# Patient Record
Sex: Female | Born: 1980 | Race: White | Hispanic: No | Marital: Single | Smoking: Current every day smoker
Health system: Southern US, Community
[De-identification: ages and names within clinical notes are randomized; demographics above are authoritative.]

## PROBLEM LIST (undated history)

## (undated) DIAGNOSIS — F329 Major depressive disorder, single episode, unspecified: Secondary | ICD-10-CM

## (undated) DIAGNOSIS — F419 Anxiety disorder, unspecified: Secondary | ICD-10-CM

## (undated) DIAGNOSIS — R51 Headache: Secondary | ICD-10-CM

## (undated) DIAGNOSIS — F1990 Other psychoactive substance use, unspecified, uncomplicated: Secondary | ICD-10-CM

## (undated) DIAGNOSIS — J449 Chronic obstructive pulmonary disease, unspecified: Secondary | ICD-10-CM

## (undated) DIAGNOSIS — F250 Schizoaffective disorder, bipolar type: Secondary | ICD-10-CM

## (undated) DIAGNOSIS — F32A Depression, unspecified: Secondary | ICD-10-CM

## (undated) DIAGNOSIS — F259 Schizoaffective disorder, unspecified: Secondary | ICD-10-CM

## (undated) DIAGNOSIS — J189 Pneumonia, unspecified organism: Secondary | ICD-10-CM

## (undated) DIAGNOSIS — D649 Anemia, unspecified: Secondary | ICD-10-CM

## (undated) HISTORY — DX: Depression, unspecified: F32.A

## (undated) HISTORY — DX: Major depressive disorder, single episode, unspecified: F32.9

---

## 2001-02-27 HISTORY — PX: THYROID SURGERY: SHX805

## 2007-09-04 ENCOUNTER — Emergency Department (HOSPITAL_COMMUNITY): Admission: EM | Admit: 2007-09-04 | Discharge: 2007-09-05 | Payer: Self-pay | Admitting: Emergency Medicine

## 2007-11-09 ENCOUNTER — Emergency Department (HOSPITAL_COMMUNITY): Admission: EM | Admit: 2007-11-09 | Discharge: 2007-11-09 | Payer: Self-pay | Admitting: Emergency Medicine

## 2008-01-29 ENCOUNTER — Emergency Department (HOSPITAL_COMMUNITY): Admission: EM | Admit: 2008-01-29 | Discharge: 2008-01-29 | Payer: Self-pay | Admitting: Emergency Medicine

## 2008-06-23 ENCOUNTER — Inpatient Hospital Stay (HOSPITAL_COMMUNITY): Admission: EM | Admit: 2008-06-23 | Discharge: 2008-06-26 | Payer: Self-pay | Admitting: Emergency Medicine

## 2008-06-28 ENCOUNTER — Observation Stay (HOSPITAL_COMMUNITY): Admission: EM | Admit: 2008-06-28 | Discharge: 2008-06-28 | Payer: Self-pay | Admitting: Emergency Medicine

## 2008-07-06 ENCOUNTER — Emergency Department (HOSPITAL_COMMUNITY): Admission: EM | Admit: 2008-07-06 | Discharge: 2008-07-06 | Payer: Self-pay | Admitting: Emergency Medicine

## 2008-09-03 ENCOUNTER — Emergency Department (HOSPITAL_COMMUNITY): Admission: EM | Admit: 2008-09-03 | Discharge: 2008-09-03 | Payer: Self-pay | Admitting: Emergency Medicine

## 2009-06-26 ENCOUNTER — Observation Stay (HOSPITAL_COMMUNITY): Admission: EM | Admit: 2009-06-26 | Discharge: 2009-06-27 | Payer: Self-pay | Admitting: Emergency Medicine

## 2009-12-06 IMAGING — CR DG ANKLE COMPLETE 3+V*R*
3 series · 3 of 3 positions shown · non-contrast
Comparison: None

CLINICAL DATA: Ankle injury post fall

RIGHT ANKLE - COMPLETE 3+ VIEW

[t ankle joint ap right]
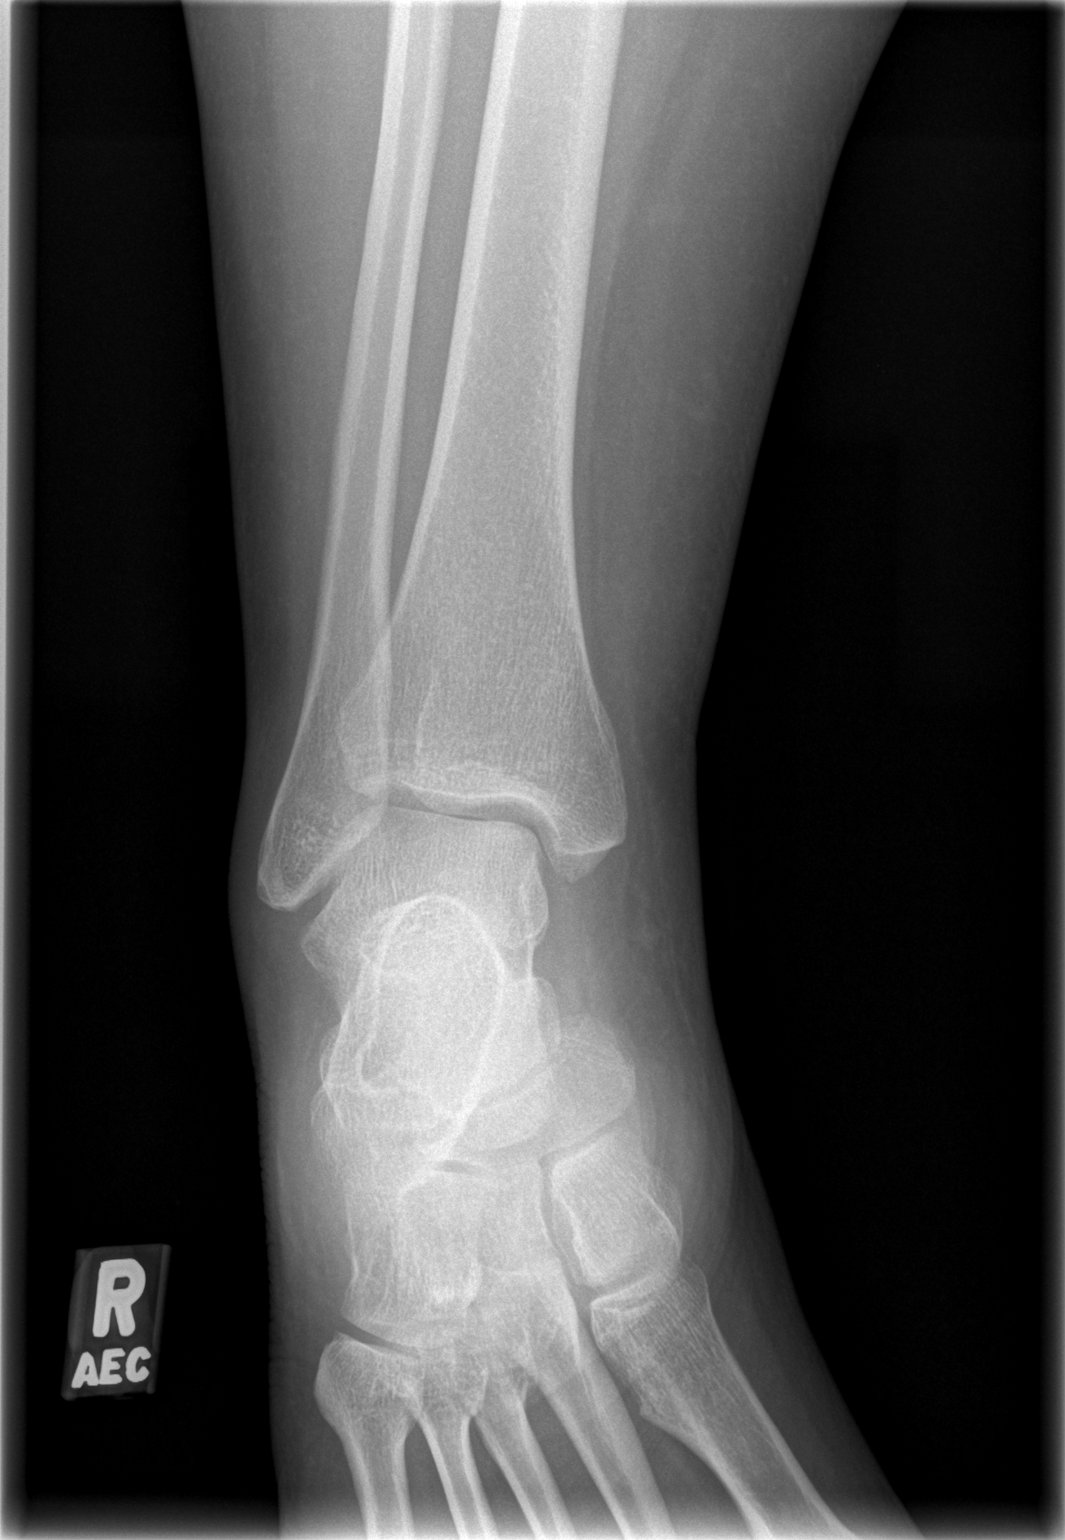

[t ankle joint oblique right]
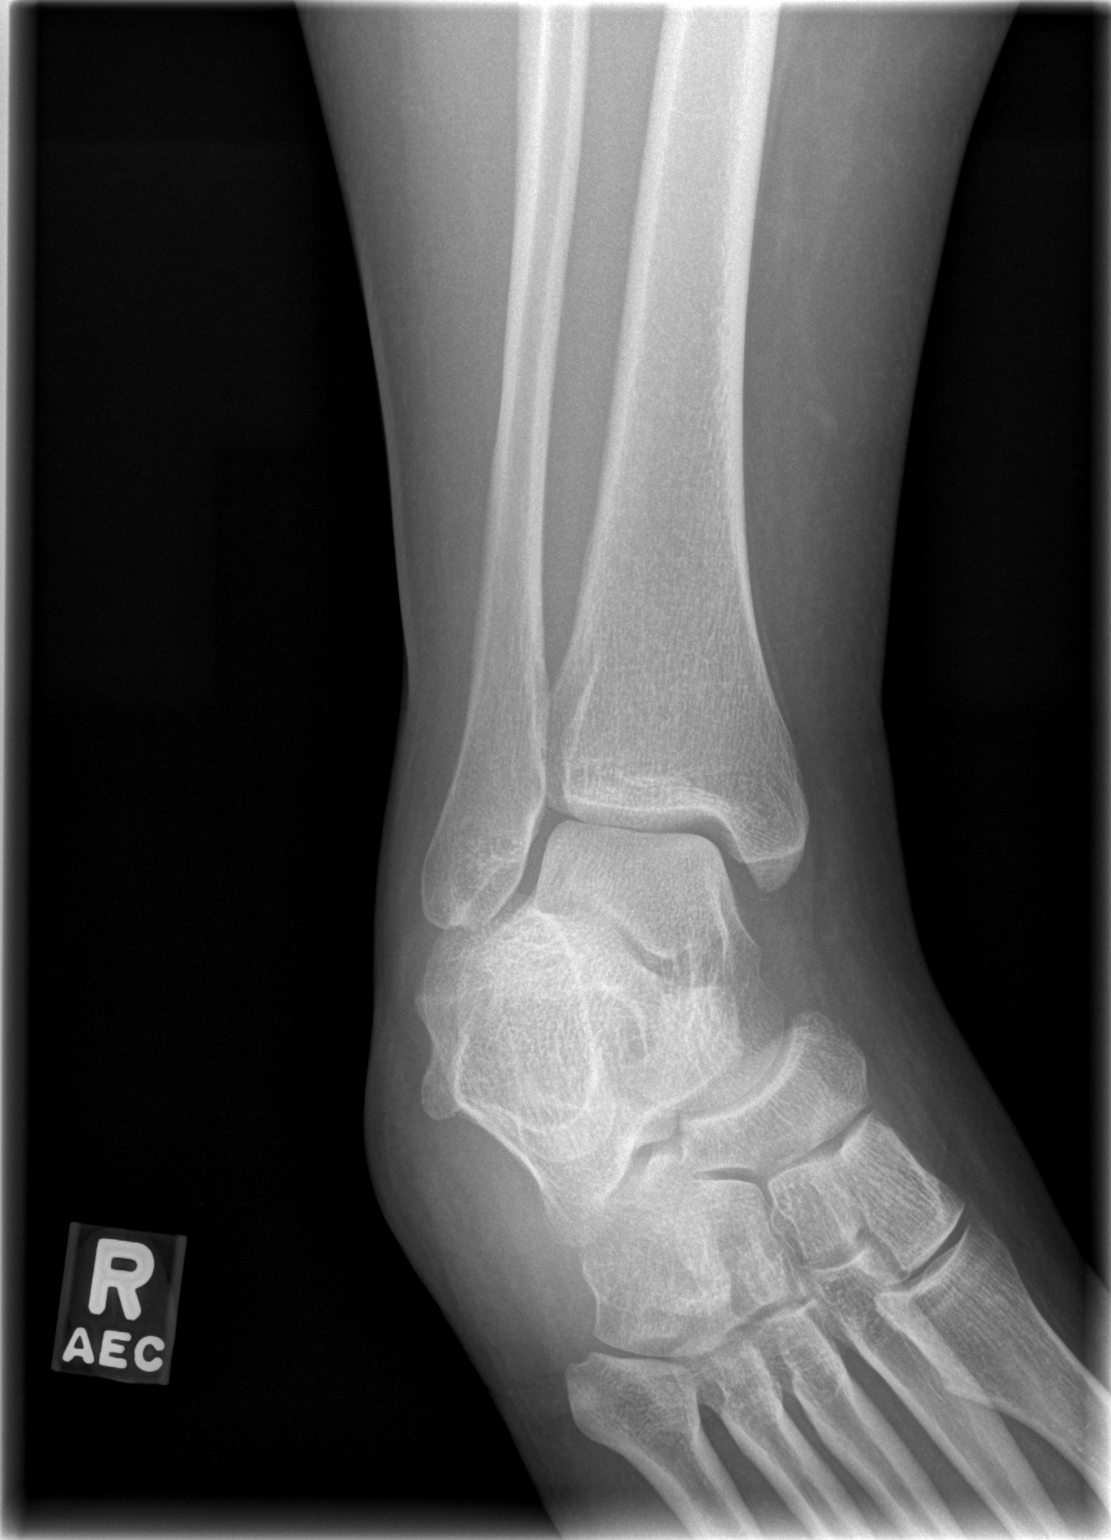

[t ankle joint lat right]
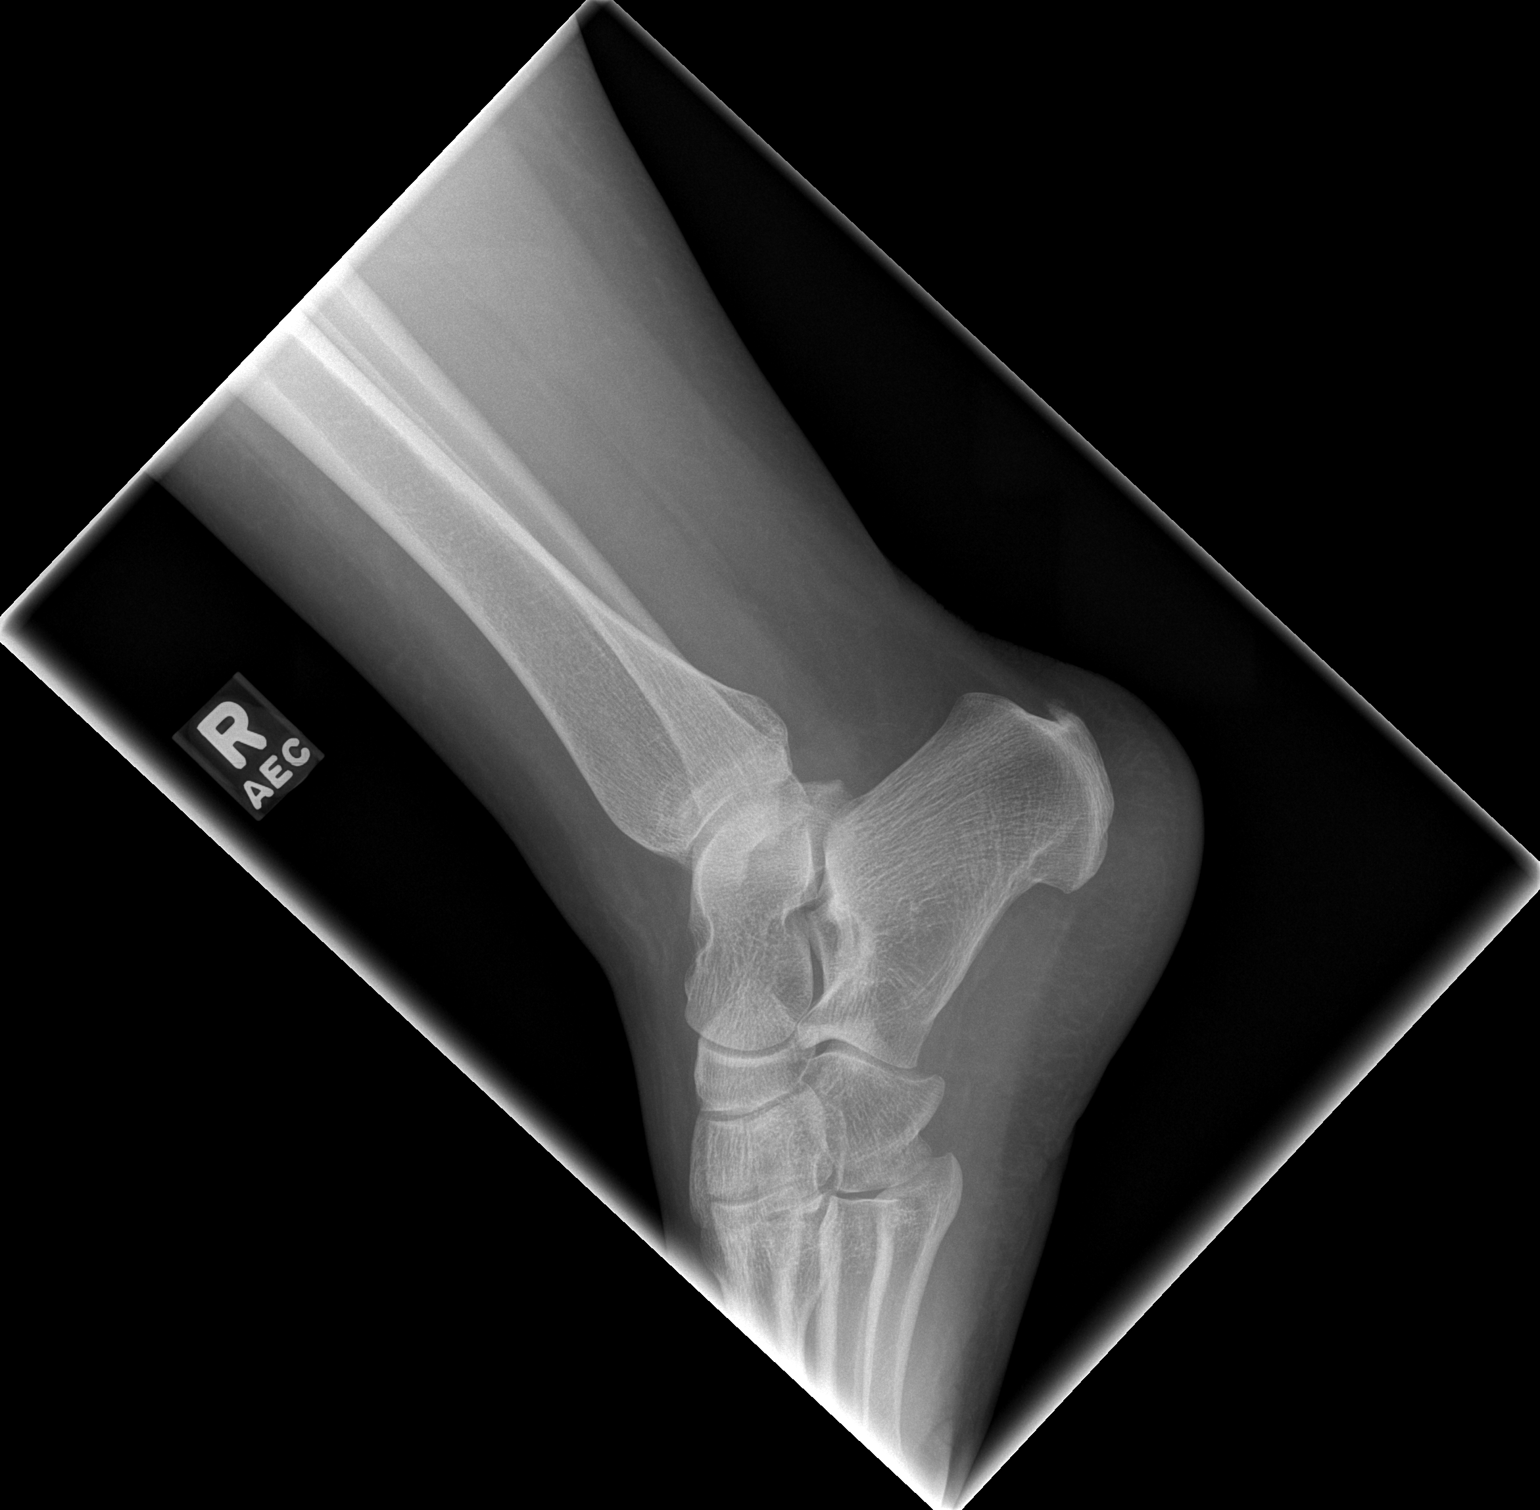

[3 of 3 positions shown; findings below may reference images not displayed]

FINDINGS: Three views of the right ankle shows no acute fracture or
subluxation.  Spurring of posterior aspect of the calcaneus noted
at the Achilles tendon insertion.
IMPRESSION: No right ankle acute fracture or subluxation.

## 2009-12-18 ENCOUNTER — Inpatient Hospital Stay (HOSPITAL_COMMUNITY)
Admission: EM | Admit: 2009-12-18 | Discharge: 2009-12-20 | Payer: Self-pay | Source: Home / Self Care | Admitting: Emergency Medicine

## 2010-02-02 ENCOUNTER — Emergency Department (HOSPITAL_COMMUNITY)
Admission: EM | Admit: 2010-02-02 | Discharge: 2010-02-02 | Payer: Self-pay | Source: Home / Self Care | Admitting: Emergency Medicine

## 2010-05-11 LAB — BASIC METABOLIC PANEL
BUN: 10 mg/dL (ref 6–23)
Calcium: 8.9 mg/dL (ref 8.4–10.5)
Creatinine, Ser: 0.75 mg/dL (ref 0.4–1.2)
GFR calc non Af Amer: >60 mL/min (ref >60)
Glucose, Bld: 97 mg/dL (ref 70–99)

## 2010-05-11 LAB — DIFFERENTIAL
Basophils Absolute: 0 10*3/uL (ref 0.0–0.1)
Basophils Relative: 0 % (ref 0–1)
Lymphocytes Relative: 21 % (ref 12–46)
Monocytes Absolute: 0.7 10*3/uL (ref 0.1–1.0)
Neutro Abs: 9.6 10*3/uL — ABNORMAL HIGH (ref 1.7–7.7)
Neutrophils Relative %: 72 % (ref 43–77)

## 2010-05-11 LAB — CBC
HCT: 41 % (ref 36.0–46.0)
MCHC: 34.8 g/dL (ref 30.0–36.0)
Platelets: 246 10*3/uL (ref 150–400)
RDW: 13.6 % (ref 11.5–15.5)
WBC: 13.5 10*3/uL — ABNORMAL HIGH (ref 4.0–10.5)

## 2010-05-11 LAB — TSH: TSH: 0.358 u[IU]/mL (ref 0.350–4.500)

## 2010-05-17 LAB — COMPREHENSIVE METABOLIC PANEL
ALT: 37 U/L — ABNORMAL HIGH (ref 0–35)
AST: 35 U/L (ref 0–37)
Alkaline Phosphatase: 74 U/L (ref 39–117)
BUN: 8 mg/dL (ref 6–23)
CO2: 24 mEq/L (ref 19–32)
Calcium: 8.9 mg/dL (ref 8.4–10.5)
Calcium: 8.4 mg/dL (ref 8.4–10.5)
GFR calc Af Amer: >60 mL/min (ref >60)
GFR calc non Af Amer: >60 mL/min (ref >60)
Glucose, Bld: 229 mg/dL — ABNORMAL HIGH (ref 70–99)
Potassium: 4.5 mEq/L (ref 3.5–5.1)
Sodium: 138 mEq/L (ref 135–145)
Total Protein: 7.2 g/dL (ref 6.0–8.3)
Total Protein: 7 g/dL (ref 6.0–8.3)

## 2010-05-17 LAB — BLOOD GAS, ARTERIAL
Acid-base deficit: 0.9 mmol/L (ref 0.0–2.0)
Bicarbonate: 23.2 mEq/L (ref 20.0–24.0)
Drawn by: 308601
FIO2: 0.5 %
O2 Saturation: 97.2 %
pCO2 arterial: 38.9 mmHg (ref 35.0–45.0)
pO2, Arterial: 96.9 mmHg (ref 80.0–100.0)

## 2010-05-17 LAB — CBC
HCT: 40.5 % (ref 36.0–46.0)
Hemoglobin: 13.7 g/dL (ref 12.0–15.0)
MCHC: 34 g/dL (ref 30.0–36.0)
MCHC: 33.8 g/dL (ref 30.0–36.0)
Platelets: 305 10*3/uL (ref 150–400)
RBC: 4.25 MIL/uL (ref 3.87–5.11)
RDW: 13 % (ref 11.5–15.5)

## 2010-05-17 LAB — DIFFERENTIAL
Basophils Relative: 0 % (ref 0–1)
Lymphocytes Relative: 16 % (ref 12–46)
Lymphs Abs: 2.4 10*3/uL (ref 0.7–4.0)
Monocytes Relative: 3 % (ref 3–12)
Neutro Abs: 12.1 10*3/uL — ABNORMAL HIGH (ref 1.7–7.7)
Neutrophils Relative %: 80 % — ABNORMAL HIGH (ref 43–77)

## 2010-05-17 LAB — GLUCOSE, CAPILLARY

## 2010-05-17 LAB — POCT I-STAT, CHEM 8
BUN: 7 mg/dL (ref 6–23)
Chloride: 104 mEq/L (ref 96–112)
Creatinine, Ser: 0.8 mg/dL (ref 0.4–1.2)
Hemoglobin: 14.3 g/dL (ref 12.0–15.0)
Potassium: 3.3 mEq/L — ABNORMAL LOW (ref 3.5–5.1)
Sodium: 139 mEq/L (ref 135–145)

## 2010-06-07 LAB — DIFFERENTIAL
Basophils Absolute: 0 10*3/uL (ref 0.0–0.1)
Basophils Relative: 0 % (ref 0–1)
Eosinophils Absolute: 0.1 10*3/uL (ref 0.0–0.7)
Eosinophils Relative: 0 % (ref 0–5)
Neutrophils Relative %: 83 % — ABNORMAL HIGH (ref 43–77)

## 2010-06-07 LAB — CBC
HCT: 41 % (ref 36.0–46.0)
MCV: 92.3 fL (ref 78.0–100.0)
Platelets: 276 10*3/uL (ref 150–400)
RDW: 13.3 % (ref 11.5–15.5)
WBC: 15.3 10*3/uL — ABNORMAL HIGH (ref 4.0–10.5)

## 2010-06-07 LAB — BASIC METABOLIC PANEL
BUN: 16 mg/dL (ref 6–23)
Chloride: 101 mEq/L (ref 96–112)
GFR calc non Af Amer: >60 mL/min (ref >60)
Potassium: 4.2 mEq/L (ref 3.5–5.1)
Sodium: 137 mEq/L (ref 135–145)

## 2010-06-08 LAB — T4, FREE: Free T4: 0.89 ng/dL (ref 0.80–1.80)

## 2010-06-08 LAB — CBC
HCT: 40.9 % (ref 36.0–46.0)
MCHC: 34.7 g/dL (ref 30.0–36.0)
MCV: 92.6 fL (ref 78.0–100.0)
Platelets: 332 10*3/uL (ref 150–400)
Platelets: 352 10*3/uL (ref 150–400)
Platelets: 357 10*3/uL (ref 150–400)
RDW: 12.9 % (ref 11.5–15.5)
RDW: 13 % (ref 11.5–15.5)
RDW: 13.2 % (ref 11.5–15.5)
RDW: 13.4 % (ref 11.5–15.5)
WBC: 11.8 10*3/uL — ABNORMAL HIGH (ref 4.0–10.5)

## 2010-06-08 LAB — BASIC METABOLIC PANEL
BUN: 9 mg/dL (ref 6–23)
CO2: 23 mEq/L (ref 19–32)
Calcium: 8.8 mg/dL (ref 8.4–10.5)
Creatinine, Ser: 0.72 mg/dL (ref 0.4–1.2)
Creatinine, Ser: 0.8 mg/dL (ref 0.4–1.2)
GFR calc Af Amer: >60 mL/min (ref >60)
GFR calc non Af Amer: >60 mL/min (ref >60)
Glucose, Bld: 171 mg/dL — ABNORMAL HIGH (ref 70–99)
Glucose, Bld: 180 mg/dL — ABNORMAL HIGH (ref 70–99)
Potassium: 4.4 mEq/L (ref 3.5–5.1)

## 2010-06-08 LAB — POCT PREGNANCY, URINE: Preg Test, Ur: NEGATIVE

## 2010-06-08 LAB — COMPREHENSIVE METABOLIC PANEL
ALT: 34 U/L (ref 0–35)
AST: 39 U/L — ABNORMAL HIGH (ref 0–37)
Albumin: 3.6 g/dL (ref 3.5–5.2)
Alkaline Phosphatase: 84 U/L (ref 39–117)
BUN: 9 mg/dL (ref 6–23)
Chloride: 100 mEq/L (ref 96–112)
GFR calc Af Amer: >60 mL/min (ref >60)
Potassium: 4 mEq/L (ref 3.5–5.1)
Sodium: 136 mEq/L (ref 135–145)
Total Protein: 7.8 g/dL (ref 6.0–8.3)

## 2010-06-08 LAB — DIFFERENTIAL
Basophils Relative: 0 % (ref 0–1)
Eosinophils Relative: 0 % (ref 0–5)
Monocytes Absolute: 0.2 10*3/uL (ref 0.1–1.0)
Monocytes Relative: 1 % — ABNORMAL LOW (ref 3–12)
Neutro Abs: 10.4 10*3/uL — ABNORMAL HIGH (ref 1.7–7.7)

## 2010-06-08 LAB — TSH: TSH: 0.221 u[IU]/mL — ABNORMAL LOW (ref 0.350–4.500)

## 2010-07-12 NOTE — H&P (Signed)
[name], [name]               ACCOUNT NO.:  [id number]   MEDICAL RECORD NO.:  [id number]          PATIENT TYPE:  INP   LOCATION:  0107                         FACILITY:  [facility]   PHYSICIAN:  Theodosia Paling, MD    DATE OF BIRTH:  1980/11/09   DATE OF ADMISSION:  06/23/2008  DATE OF DISCHARGE:                              HISTORY & PHYSICAL   PRIMARY CARE PHYSICIAN:  Patient does not have a primary care physician.   CHIEF COMPLAINT:  Shortness of breath.   HISTORY OF PRESENT ILLNESS:  [name] is a very pleasant young lady who was  in her usual state of health until around one week back when she started  to experience rhinorrhea and sore throat, itchy eyes, runny nose.  She  thought it was because of allergies in the past weeks.  However, when  she started to have shortness of breath and wheezing, she got so short  of breath in the last few days that she was not able to catch her  breath, even by walking merely to the bathroom.  This prompted her to  seek emergency care at [facility].  In the ER, after receiving  several doses of albuterol nebulizer, she was still desaturating and  clinically looked in distress.  Therefore, [facility]  was contacted for further evaluation and management.  Chest x-ray done  in the emergency room was negative for any acute infiltrate.   REVIEW OF SYSTEMS:  Essentially negative except for what is mentioned  above in HPI.   PAST MEDICAL HISTORY:  None.   PAST SURGICAL HISTORY:  1. Thyroidectomy.  2. LSCS.   SOCIAL HISTORY:  Patient smokes 1/2 pack to 1 pack a day.  Denies  alcohol or IV drug abuse.  Patient has been smoking tobacco like that  for the last 10 years.  She does not work.  She is currently on  Medicaid.  She lives with her fiance.  She has children from him.   FAMILY HISTORY:  Mother had asthma, COPD and diabetes mellitus.   HOME MEDICATIONS:  None.   PHYSICAL EXAMINATION:  VITAL SIGNS:  Blood  pressure 144/95, heart rate  88, respiratory rate 18-20, temperature 97.9, oxygen saturation 91% on 2  L nasal cannula oxygen.  GENERAL:  Patient appears to be in mild respiratory distress.  HEENT:  No ear discharge.  EOMs intact.  LUNGS:  Occasional wheezing present, decreased air entry at lung bases.  CVS:  Distant sinus 2.  GI:  Obese, soft, nontender.  EXTREMITIES:  No pedal edema.  PSYCHIATRIC:  Oriented x3.  CENTRAL NERVOUS SYSTEM:  Follows commands, speech intact.   LABORATORY DATA:  Not available for review.   Chest x-ray:  No acute infiltrate.   ASSESSMENT/PLAN:  1. Asthma exacerbation.  There was no evidence of infiltrate.      Therefore, there is no indication for antibiotics.  We will start      the patient on IV Solu-Medrol and albuterol p.r.n.  At the time of      discharge, an assessment for inhaled steroids can be  made.  We will      reassess oxygen requirement in the morning.  Regimen of albuterol      is written for and q.2 h. p.r.n. is requested as well.  2. Tobacco abuse.  We will put the patient on nicotine patch.  Patient      is counseled regarding the risks of tobacco abuse, and she was      counseled for that.  She expresses her willingness to quit smoking.  3. Morbid obesity.  Patient has endocrine thyroidectomy.  It is      surprising that she is not on thyroid replacement at that time.      She has not seen a physician for a long time.  Therefore, I am      going to go ahead and request a TSH and free T4 on her to assess      her thyroid status.  4. Prophylaxis.  Deep venous thrombosis and gastrointestinal      prophylaxis requested.  5. Code status.  Patient is full code.   Total time spent in admission of this patient is around 1 hour.  The  patient does not have a primary care physician to which this admission  note can be forwarded.      Theodosia Paling, MD  Electronically Signed     NP/MEDQ  D:  06/23/2008  T:  06/23/2008  Job:  [id number]

## 2010-07-12 NOTE — Discharge Summary (Signed)
[name], [name]               ACCOUNT NO.:  [id number]   MEDICAL RECORD NO.:  [id number]          PATIENT TYPE:  INP   LOCATION:  4714                         FACILITY:  [facility]   PHYSICIAN:  Renee Ramus, MD       DATE OF BIRTH:  1980-07-11   DATE OF ADMISSION:  06/27/2008  DATE OF DISCHARGE:  06/28/2008                               DISCHARGE SUMMARY   PRIMARY DISCHARGE DIAGNOSIS  Acute Asthma Exacerbation   <SECONDARY DIAGNOSES/>  1. Morbid obesity.  2. Hypothyroid.  3. Tobacco abuse.   HOSPITAL COURSE:  1. Asthma exacerbation.  The patient is a 30 year old female recently      discharged from [name] secondary to asthma exacerbation now      representing with an worsening wheezing and shortness of breath.      The patient was seen in the emergency room and admitted to our      service.  The patient has had neb treatments.  She is now stable      for discharge.  The patient has no evidence of wheezing at all.      She is not requiring supplemental oxygen and all the way we have      not checked her peak flows, I believe they are going to be      substantial.  The patient has been counseled with respect to      following her prednisone tapered, to continuing her albuterol, and      to establish care with [facility].  2. Hypothyroid.  The patient will be continued on Synthroid 25 mcg      p.o. daily.  3. Morbid obesity, stable.  4. Tobacco abuse.  The patient will attempt to give up smoking.   LABORATORY DATA:  1. Leukocytosis likely secondary from steroids with a white count of      15.3.  The differentials predominantly neutrophils with no evidence      of bands.  2. Elevated blood glucose at 218, likely effect from steroids.  3. TSH 0.29 with a free T4 of 0.9 representing the subclinical      hypothyroidism, but she has only recently begun medication.  4. Calcium at 8.1, which was somewhat hypocalcemic but in the presence      of steroids this may not represent  actual hypocalcemia.   STUDIES:  Two-view of the chest shows no acute disease.   DISCHARGE MEDICATIONS:  1. Prednisone with taper.  She is to continue the taper that she had      after discharge from [name] on June 26, 2008.  The patient      has not filled her prescriptions and she plans to do this.  2. Albuterol 2 puffs inhaled q.6 p.r.n.  3. Synthroid 25 mcg p.o. daily.   The steroid taper written for was prednisone 60 mg daily x3 days, then  50 mg daily x3 days, then 40 mg daily x3 days, then 30 mg daily for 3  days, then 20 mg daily for 3 days, and then 10 mg daily for 3 days.  PENDING LABORATORY WORK/STUDIES:  There are no labs or studies pending  at time of discharge.   CONDITION ON DISCHARGE:  The patient is in stable condition and ready  for discharge.   TIME SPENT:  35 minutes.      Renee Ramus, MD  Electronically Signed     JF/MEDQ  D:  06/28/2008  T:  06/29/2008  Job:  [id number]

## 2010-07-12 NOTE — Discharge Summary (Signed)
[name], [name]               ACCOUNT NO.:  [id number]   MEDICAL RECORD NO.:  [id number]          PATIENT TYPE:  INP   LOCATION:  1524                         FACILITY:  [facility]   PHYSICIAN:  Theodosia Paling, MD    DATE OF BIRTH:  [birth year]   DATE OF ADMISSION:  06/23/2008  DATE OF DISCHARGE:  06/26/2008                               DISCHARGE SUMMARY   PRIMARY CARE PHYSICIAN:  The patient does not have a PCP at this time.   ADMITTING HISTORY:  Please refer to the admission note dictated by me  and the history of present illness.   DISCHARGE DIAGNOSES:  1. Asthma exacerbation.  2. Tobacco abuse.  3. Hypothyroidism.   DISCHARGE MEDICATIONS:  Include the following:  1. Prednisone 60 mg p.o. daily for 3 years, then 50 mg p.o. daily for      3 days, then 40 mg p.o. daily for 3 days, then 30 mg daily for 3      days, then 20 mg p.o. daily for 3 years, then 10 mg p.o. daily for      3 months.  2. Synthroid 0.25 mg p.o. daily.  3. Albuterol 2.5 mg nebulizers q.6 h. p.r.n.   HOSPITAL COURSE:  Following issues were addressed during the  hospitalization:  1. Asthma exacerbation.  The patient was started on IV Solu-Medrol and      albuterol.  Her chest x-ray was negative for infiltrates.  The      patient is feeling significantly better today and feels she is      ready to go back home and is back to her baseline.  I am      discharging her on tapered off prednisone and albuterol p.r.n.      nebulizers.  2. Tobacco abuse.  The patient was counseled against tobacco abuse      given her asthma and health in general.  She has expressed interest      to quit smoking.  3. Hypothyroidism.  The patient has undergone thyroidectomy.  For some      reason, she was not on supplemental dose of Synthroid.  Her TSH was      found to 0.2.  I have started her on a very low dose of Synthroid.      She will get a follow-up TSH done in 1 months' time.   PROCEDURE PERFORMED:  None.   CONSULTATION  PERFORMED:  None.   IMAGING PERFORMED:  As mentioned in hospital course.   DISPOSITION:  The patient will establish primary care with whom she is  going to follow up within 1 week's time for assessment and further  management of asthma and hypothyroidism.      Theodosia Paling, MD  Electronically Signed     NP/MEDQ  D:  06/26/2008  T:  06/26/2008  Job:  [id number]

## 2010-07-12 NOTE — H&P (Signed)
[name], [name]               ACCOUNT NO.:  [id number]   MEDICAL RECORD NO.:  [id number]          PATIENT TYPE:  INP   LOCATION:  4714                         FACILITY:  [facility]   PHYSICIAN:  Della Goo, M.D. DATE OF BIRTH:  07-05-1980   DATE OF ADMISSION:  06/27/2008  DATE OF DISCHARGE:  06/28/2008                              HISTORY & PHYSICAL   DATE OF ADMISSION:  1610 03/2008.   PRIMARY CARE PHYSICIAN:  Unassigned.   CHIEF COMPLAINT:  Shortness of breath   HISTORY OF PRESENT ILLNESS:  This is a 30 year old female who presents  to the emergency department secondary to worsening symptoms of shortness  of breath, chest tightness, wheezing, and a cough which is  nonproductive.  The patient denies having any fevers or chills.  She was  recently hospitalized from April 27 until April 30 at [facility] for an asthma exacerbation and was treated and started on a  steroid taper which was to be completed as an outpatient.  The patient  returned, however, with worsening of her symptoms.  The patient denies  having any fevers, chills, nausea, vomiting, diarrhea, chest pain.  Denies any bowel or bladder changes.  Denies having any myalgias or  arthralgias.   PAST MEDICAL HISTORY:  Significant for asthma, tobacco abuse,  hypothyroidism, and morbid obesity.   MEDICATIONS:  Her medications include the prednisone oral taper,  albuterol, and Synthroid.   ALLERGIES:  NO KNOWN DRUG ALLERGIES.   SOCIAL HISTORY:  Patient is a smoker, smokes one-pack of cigarettes  daily for 10 years.  She is a nondrinker.  She denies any illicit drug  usage.   FAMILY HISTORY:  Negative for coronary artery disease, diabetes,  hypertension, and cancer.   REVIEW OF SYSTEMS:  Pertinents are mentioned above.  All other systems  are negative.   PHYSICAL EXAMINATION FINDINGS:  This is a morbidly obese 30 year old  female who is in discomfort but no acute distress.  VITAL SIGNS:  Temperature  97.6, blood pressure 139/111 initially, heart  rate 115, respirations 16, O2 sats 92%.  HEENT:  Normocephalic, atraumatic.  Pupils equally round and reactive to  light.  Extraocular movements are intact.  Funduscopic benign.  Nares  are patent bilaterally.  Oropharynx is clear.  NECK:  Supple with full range of motion.  No thyromegaly, adenopathy or  jugular venous distention.  CARDIOVASCULAR:  Tachycardiac rate and rhythm.  No murmurs, gallops or  rubs.  LUNGS:  Decreased with occasional expiratory wheezes.  ABDOMEN:  Positive bowel sounds, soft, nontender, and nondistended.  EXTREMITIES:  Without cyanosis, clubbing or edema.  NEUROLOGIC:  Alert and oriented x3.  There are no focal deficits on  examination.   Chest x-ray reveals no acute disease process.   ASSESSMENT:  A 30 year old female readmitted with:  1. Shortness of breath.  2. Asthma exacerbation.  3. Upper respiratory infection.  4. Tobacco abuse.  5. History of hypothyroidism.  6. Morbid obesity.   PLAN:  The patient will be admitted and restarted on an IV steroid  taper.  Albuterol and Atrovent nebulizer treatments have also  been  ordered.  The patient will also be placed on oral azithromycin therapy  and Mucinex therapy as needed.  IV fluids have also been ordered and the  patient will be placed on DVT and GI prophylaxis.  A nicotine patch has  also been ordered.      Della Goo, M.D.  Electronically Signed     HJ/MEDQ  D:  06/28/2008  T:  06/28/2008  Job:  [id number]

## 2010-11-24 LAB — BASIC METABOLIC PANEL
Calcium: 9.2
GFR calc Af Amer: >60
GFR calc non Af Amer: >60
Glucose, Bld: 85
Sodium: 139

## 2010-11-24 LAB — URINALYSIS, ROUTINE W REFLEX MICROSCOPIC
Bilirubin Urine: NEGATIVE
Glucose, UA: NEGATIVE
Ketones, ur: NEGATIVE
Protein, ur: NEGATIVE
pH: 6

## 2010-11-24 LAB — WET PREP, GENITAL
Trich, Wet Prep: NONE SEEN
Yeast Wet Prep HPF POC: NONE SEEN

## 2010-11-24 LAB — GC/CHLAMYDIA PROBE AMP, GENITAL
Chlamydia, DNA Probe: NEGATIVE
GC Probe Amp, Genital: NEGATIVE

## 2010-11-24 LAB — DIFFERENTIAL
Basophils Relative: 0
Eosinophils Absolute: 0.7
Eosinophils Relative: 5
Lymphs Abs: 4.1 — ABNORMAL HIGH
Monocytes Relative: 1 — ABNORMAL LOW
Neutrophils Relative %: 64

## 2010-11-24 LAB — CBC
Hemoglobin: 14.6
MCHC: 34.8
Platelets: 295
RBC: 4.62

## 2010-11-24 LAB — PREGNANCY, URINE: Preg Test, Ur: NEGATIVE

## 2010-12-02 LAB — URINALYSIS, ROUTINE W REFLEX MICROSCOPIC
Bilirubin Urine: NEGATIVE
Glucose, UA: NEGATIVE mg/dL
Ketones, ur: NEGATIVE mg/dL
Nitrite: NEGATIVE
Specific Gravity, Urine: 1.016 (ref 1.005–1.030)
Urobilinogen, UA: 1 mg/dL (ref 0.0–1.0)
pH: 6.5 (ref 5.0–8.0)

## 2010-12-02 LAB — COMPREHENSIVE METABOLIC PANEL
ALT: 20 U/L (ref 0–35)
AST: 21 U/L (ref 0–37)
Albumin: 4.1 g/dL (ref 3.5–5.2)
CO2: 26 mEq/L (ref 19–32)
Calcium: 9.2 mg/dL (ref 8.4–10.5)
GFR calc Af Amer: >60 mL/min (ref >60)
GFR calc non Af Amer: >60 mL/min (ref >60)
Sodium: 139 mEq/L (ref 135–145)
Total Protein: 7.1 g/dL (ref 6.0–8.3)

## 2010-12-02 LAB — POCT I-STAT, CHEM 8
Calcium, Ion: 1.16 mmol/L (ref 1.12–1.32)
Creatinine, Ser: 0.9 mg/dL (ref 0.4–1.2)
Glucose, Bld: 100 mg/dL — ABNORMAL HIGH (ref 70–99)
HCT: 44 % (ref 36.0–46.0)
Hemoglobin: 15 g/dL (ref 12.0–15.0)
TCO2: 26 mmol/L (ref 0–100)

## 2010-12-02 LAB — DIFFERENTIAL
Eosinophils Absolute: 0.5 10*3/uL (ref 0.0–0.7)
Eosinophils Relative: 4 % (ref 0–5)
Lymphs Abs: 4 10*3/uL (ref 0.7–4.0)
Monocytes Absolute: 0.6 10*3/uL (ref 0.1–1.0)
Monocytes Relative: 4 % (ref 3–12)

## 2010-12-02 LAB — WET PREP, GENITAL
Trich, Wet Prep: NONE SEEN
Yeast Wet Prep HPF POC: NONE SEEN

## 2010-12-02 LAB — URINE CULTURE: Colony Count: 60000

## 2010-12-02 LAB — CBC
MCHC: 34 g/dL (ref 30.0–36.0)
RBC: 4.67 MIL/uL (ref 3.87–5.11)

## 2010-12-02 LAB — GC/CHLAMYDIA PROBE AMP, GENITAL: Chlamydia, DNA Probe: NEGATIVE

## 2011-05-30 ENCOUNTER — Telehealth (HOSPITAL_COMMUNITY): Payer: Self-pay | Admitting: *Deleted

## 2011-05-30 ENCOUNTER — Inpatient Hospital Stay (HOSPITAL_COMMUNITY)
Admission: AD | Admit: 2011-05-30 | Discharge: 2011-06-08 | DRG: 885 | Disposition: A | Discharge status: Home / Self Care | Payer: Medicaid Other | Attending: Psychiatry | Admitting: Psychiatry

## 2011-05-30 ENCOUNTER — Encounter (HOSPITAL_COMMUNITY): Payer: Self-pay | Admitting: Behavioral Health

## 2011-05-30 ENCOUNTER — Encounter (HOSPITAL_COMMUNITY): Payer: Self-pay | Admitting: *Deleted

## 2011-05-30 DIAGNOSIS — F3130 Bipolar disorder, current episode depressed, mild or moderate severity, unspecified: Principal | ICD-10-CM | POA: Diagnosis present

## 2011-05-30 DIAGNOSIS — F17200 Nicotine dependence, unspecified, uncomplicated: Secondary | ICD-10-CM | POA: Diagnosis present

## 2011-05-30 DIAGNOSIS — F1920 Other psychoactive substance dependence, uncomplicated: Secondary | ICD-10-CM | POA: Diagnosis present

## 2011-05-30 DIAGNOSIS — F1010 Alcohol abuse, uncomplicated: Secondary | ICD-10-CM | POA: Diagnosis present

## 2011-05-30 DIAGNOSIS — R12 Heartburn: Secondary | ICD-10-CM | POA: Diagnosis not present

## 2011-05-30 DIAGNOSIS — J45909 Unspecified asthma, uncomplicated: Secondary | ICD-10-CM | POA: Diagnosis present

## 2011-05-30 DIAGNOSIS — F411 Generalized anxiety disorder: Secondary | ICD-10-CM | POA: Diagnosis present

## 2011-05-30 DIAGNOSIS — F19939 Other psychoactive substance use, unspecified with withdrawal, unspecified: Secondary | ICD-10-CM | POA: Diagnosis present

## 2011-05-30 DIAGNOSIS — F250 Schizoaffective disorder, bipolar type: Secondary | ICD-10-CM | POA: Diagnosis present

## 2011-05-30 DIAGNOSIS — F1994 Other psychoactive substance use, unspecified with psychoactive substance-induced mood disorder: Secondary | ICD-10-CM | POA: Diagnosis present

## 2011-05-30 HISTORY — DX: Pneumonia, unspecified organism: J18.9

## 2011-05-30 HISTORY — DX: Anxiety disorder, unspecified: F41.9

## 2011-05-30 HISTORY — DX: Headache: R51

## 2011-05-30 HISTORY — DX: Anemia, unspecified: D64.9

## 2011-05-30 MED ORDER — MAGNESIUM HYDROXIDE 400 MG/5ML PO SUSP: 30.0000 mL | Freq: Every day | ORAL | Status: DC | PRN

## 2011-05-30 MED ORDER — TRAZODONE HCL 100 MG PO TABS
100.0000 mg | ORAL_TABLET | Freq: Every day | ORAL | Status: DC
  Administered 2011-05-30 – 2011-06-04 (×6): 100 mg via ORAL
  Filled 2011-05-30 (×9): qty 1

## 2011-05-30 MED ORDER — ACETAMINOPHEN 325 MG PO TABS
650.0000 mg | ORAL_TABLET | Freq: Four times a day (QID) | ORAL | Status: DC | PRN
  Administered 2011-05-30 – 2011-06-07 (×7): 650 mg via ORAL

## 2011-05-30 MED ORDER — HYDROXYZINE HCL 25 MG PO TABS
25.0000 mg | ORAL_TABLET | Freq: Two times a day (BID) | ORAL | Status: DC | PRN
  Administered 2011-05-30 – 2011-05-31 (×3): 25 mg via ORAL

## 2011-05-30 MED ORDER — TRAZODONE HCL 50 MG PO TABS: 50.0000 mg | ORAL_TABLET | Freq: Every evening | ORAL | Status: DC | PRN

## 2011-05-30 MED ORDER — FLUOXETINE HCL 20 MG PO CAPS
60.0000 mg | ORAL_CAPSULE | Freq: Every day | ORAL | Status: DC
  Administered 2011-05-30 – 2011-06-02 (×4): 60 mg via ORAL
  Filled 2011-05-30 (×7): qty 3

## 2011-05-30 MED ORDER — ALUM & MAG HYDROXIDE-SIMETH 200-200-20 MG/5ML PO SUSP: 30.0000 mL | ORAL | Status: DC | PRN

## 2011-05-30 MED ORDER — ACETAMINOPHEN 325 MG PO TABS: 650.0000 mg | ORAL_TABLET | Freq: Four times a day (QID) | ORAL | Status: DC | PRN

## 2011-05-30 MED ORDER — NICOTINE 21 MG/24HR TD PT24
21.0000 mg | MEDICATED_PATCH | Freq: Every day | TRANSDERMAL | Status: DC
  Administered 2011-05-30 – 2011-06-08 (×10): 21 mg via TRANSDERMAL
  Filled 2011-05-30 (×7): qty 1
  Filled 2011-05-30: qty 7
  Filled 2011-05-30: qty 11
  Filled 2011-05-30 (×2): qty 1

## 2011-05-30 MED ORDER — CYCLOBENZAPRINE HCL 10 MG PO TABS
10.0000 mg | ORAL_TABLET | Freq: Three times a day (TID) | ORAL | Status: DC | PRN
  Administered 2011-05-31 – 2011-06-01 (×3): 10 mg via ORAL
  Filled 2011-05-30 (×3): qty 1
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
## 2011-05-30 NOTE — Progress Notes (Signed)
Patient ID: [name], female   DOB: 1980-12-19, 31 y.o.   MRN: [id number] Patient denied SI while talking to nurse, contracts for safety.   Denied HI.   Denied A/V hallucinations at this time.   Denied pain.  Stated she feels anxious and depressed.  Stated her oldest child in 41 years old and has twins.  All children are in foster care.  Patient
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
## 2011-05-30 NOTE — Tx Team (Signed)
Initial Interdisciplinary Treatment Plan  PATIENT STRENGTHS: (choose at least two) Capable of independent living General fund of knowledge  PATIENT STRESSORS: Health problems Substance abuse Traumatic event   PROBLEM LIST: Problem List/Patient Goals Date to be addressed Date deferred Reason deferred Estimated date of resolution  SI 05/30/2011     Depression 05/30/2011                                                DISCHARGE CRITERIA:  Need for constant or close observation no longer present  PRELIMINARY DISCHARGE PLAN: Return to previous living arrangement  PATIENT/FAMIILY INVOLVEMENT: This treatment plan has been presented to and reviewed with the patient, Tishanna Dunford, and/or family member.  The patient and family have been given the opportunity to ask questions and make suggestions.  Trinda Pascal B 05/30/2011, 11:55 PM

## 2011-05-31 DIAGNOSIS — F411 Generalized anxiety disorder: Secondary | ICD-10-CM

## 2011-05-31 DIAGNOSIS — F250 Schizoaffective disorder, bipolar type: Secondary | ICD-10-CM | POA: Diagnosis present

## 2011-05-31 DIAGNOSIS — F1994 Other psychoactive substance use, unspecified with psychoactive substance-induced mood disorder: Secondary | ICD-10-CM

## 2011-05-31 DIAGNOSIS — F3130 Bipolar disorder, current episode depressed, mild or moderate severity, unspecified: Principal | ICD-10-CM

## 2011-05-31 LAB — CBC
HCT: 41.5 % (ref 36.0–46.0)
Hemoglobin: 14 g/dL (ref 12.0–15.0)
MCH: 31.6 pg (ref 26.0–34.0)
MCHC: 33.7 g/dL (ref 30.0–36.0)
RDW: 12.5 % (ref 11.5–15.5)

## 2011-05-31 LAB — URINALYSIS, ROUTINE W REFLEX MICROSCOPIC
Bilirubin Urine: NEGATIVE
Glucose, UA: NEGATIVE mg/dL
Nitrite: NEGATIVE
Specific Gravity, Urine: 1.022 (ref 1.005–1.030)
pH: 7 (ref 5.0–8.0)

## 2011-05-31 LAB — COMPREHENSIVE METABOLIC PANEL
Albumin: 3.4 g/dL — ABNORMAL LOW (ref 3.5–5.2)
BUN: 10 mg/dL (ref 6–23)
Calcium: 8.9 mg/dL (ref 8.4–10.5)
Creatinine, Ser: 0.92 mg/dL (ref 0.50–1.10)
GFR calc Af Amer: >90 mL/min (ref >90)
Glucose, Bld: 96 mg/dL (ref 70–99)
Total Protein: 6.4 g/dL (ref 6.0–8.3)

## 2011-05-31 LAB — RAPID URINE DRUG SCREEN, HOSP PERFORMED
Amphetamines: NOT DETECTED
Benzodiazepines: NOT DETECTED
Cocaine: POSITIVE — AB
Opiates: NOT DETECTED

## 2011-05-31 LAB — PREGNANCY, URINE: Preg Test, Ur: NEGATIVE

## 2011-05-31 LAB — URINE MICROSCOPIC-ADD ON

## 2011-05-31 LAB — ETHANOL: Alcohol, Ethyl (B): <11 mg/dL (ref 0–11)

## 2011-05-31 MED ORDER — ADULT MULTIVITAMIN W/MINERALS CH
1.0000 | ORAL_TABLET | Freq: Every day | ORAL | Status: DC
  Administered 2011-05-31 – 2011-06-08 (×9): 1 via ORAL
  Filled 2011-05-31 (×10): qty 1

## 2011-05-31 MED ORDER — HYDROXYZINE HCL 25 MG PO TABS: 25.0000 mg | ORAL_TABLET | Freq: Four times a day (QID) | ORAL | Status: DC | PRN

## 2011-05-31 MED ORDER — THIAMINE HCL 100 MG/ML IJ SOLN
100.0000 mg | Freq: Once | INTRAMUSCULAR | Status: AC
  Administered 2011-05-31: 100 mg via INTRAMUSCULAR

## 2011-05-31 MED ORDER — VITAMIN B-1 100 MG PO TABS
100.0000 mg | ORAL_TABLET | Freq: Every day | ORAL | Status: DC
  Administered 2011-06-01 – 2011-06-08 (×8): 100 mg via ORAL
  Filled 2011-05-31 (×9): qty 1

## 2011-05-31 MED ORDER — CARBAMAZEPINE 100 MG PO CHEW
100.0000 mg | CHEWABLE_TABLET | Freq: Three times a day (TID) | ORAL | Status: DC
  Administered 2011-05-31 – 2011-06-01 (×4): 100 mg via ORAL
  Filled 2011-05-31 (×10): qty 1

## 2011-05-31 MED ORDER — CARBAMAZEPINE ER 200 MG PO TB12
200.0000 mg | ORAL_TABLET | Freq: Every day | ORAL | Status: DC
Start: 2011-05-31 — End: 2011-05-31
  Filled 2011-05-31 (×2): qty 1

## 2011-05-31 MED ORDER — CARBAMAZEPINE 100 MG PO CHEW: 100.0000 mg | CHEWABLE_TABLET | Freq: Three times a day (TID) | ORAL | Status: DC

## 2011-05-31 MED ORDER — ONDANSETRON 4 MG PO TBDP: 4.0000 mg | ORAL_TABLET | Freq: Four times a day (QID) | ORAL | Status: AC | PRN

## 2011-05-31 MED ORDER — LOPERAMIDE HCL 2 MG PO CAPS: 2.0000 mg | ORAL_CAPSULE | ORAL | Status: AC | PRN

## 2011-05-31 MED ORDER — CHLORPROMAZINE HCL 25 MG PO TABS
25.0000 mg | ORAL_TABLET | Freq: Three times a day (TID) | ORAL | Status: DC
  Administered 2011-06-01 (×2): 25 mg via ORAL
  Filled 2011-05-31 (×7): qty 1

## 2011-05-31 MED ORDER — CARBAMAZEPINE ER 100 MG PO TB12
100.0000 mg | ORAL_TABLET | Freq: Every evening | ORAL | Status: AC | PRN
  Administered 2011-05-31: 100 mg via ORAL

## 2011-05-31 MED ORDER — CHLORDIAZEPOXIDE HCL 25 MG PO CAPS
25.0000 mg | ORAL_CAPSULE | Freq: Four times a day (QID) | ORAL | Status: AC | PRN
  Administered 2011-05-31 – 2011-06-03 (×4): 25 mg via ORAL
  Filled 2011-05-31 (×4): qty 1

## 2011-05-31 NOTE — Progress Notes (Signed)
05/31/2011         Time: 1415      Group Topic/Focus: The focus of this group is on enhancing patients' problem solving skills, which involves identifying the problem, brainstorming solutions and choosing and trying a solution.   Participation Level: Active  Participation Quality: Attentive  Affect: Blunted  Cognitive: Oriented   Additional Comments: None.   [name] 05/31/2011 3:42 PM

## 2011-05-31 NOTE — Progress Notes (Signed)
Pt attended discharge planning group and actively participated.  Pt presents with flat affect and depressed mood.  Pt was open with sharing reason for entering the hospital.  Pt states that she brought herself to the hospital because she didn't feel safe at home.  Pt states that she has a lot going on at home but didn't elaborate on her situation.  Pt states that she lives home alone in [place].  Pt states that she also has a long history of substance abuse.  Pt reports marijuana and alcohol use as far back as she can remember.  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  Pt ranks depression and anxiety at a 10 today.  Pt reports SI and contracts for safety.  Pt states that she follows up at [facility] in [place] and wants to switch providers.  Pt may have to follow up with this provider upon d/c but SW will assess.  No further needs voiced by pt at this time.    Reyes Ivan, LCSWA 05/31/2011  10:42 AM
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
## 2011-05-31 NOTE — Progress Notes (Signed)
[facility] MD Progress Note  05/31/2011 3:32 PM  Diagnosis:  Axis I: Bipolar, Depressed, Generalized Anxiety Disorder and Substance Induced Mood Disorder  ADL's:  Intact  Sleep: Poor  Appetite:  Good  Suicidal Ideation:  Pt has noted some suicidal thoughts, is able to contract for safety. Homicidal Ideation:  Denies adamantly any homicidal thoughts.  Mental Status Examination/Evaluation: Objective:  Appearance: Disheveled  Eye Contact::  Good  Speech:  Clear and Coherent  Volume:  Normal  Mood:  10 /10 on a scale of 1 is the best and 10 is the worst  Anxiety: 20/10 on the same scale  Affect:  Congruent  Thought Process:  Coherent  Orientation:  Full  Thought Content:  WDL  Suicidal Thoughts:  Yes.  with intent/plan  Homicidal Thoughts:  No  Memory:  Immediate;   Good  Judgement:  Impaired  Insight:  Lacking  Psychomotor Activity:  Normal  Concentration:  Fair  Recall:  Fair  Akathisia:  No  Handed:  Right  AIMS (if indicated):     Assets:  Communication Skills Desire for Improvement  Sleep:  Number of Hours: 6.75    Vital Signs:Blood pressure 108/78, pulse 91, temperature 98.8 F (37.1 C), temperature source Oral, resp. rate 18, height 5\' 5"  (1.651 m), weight 111.812 kg (246 lb 8 oz), last menstrual period 05/30/2011, SpO2 96.00%. Current Medications: Current Facility-Administered Medications  Medication Dose Route Frequency Provider Last Rate Last Dose  . acetaminophen (TYLENOL) tablet 650 mg  650 mg Oral Q6H PRN Verne Spurr, PA-C   650 mg at 05/31/11 1400  . carbamazepine (TEGRETOL XR) 12 hr tablet 200 mg  200 mg Oral QHS Sanjuana Kava, NP      . cyclobenzaprine (FLEXERIL) tablet 10 mg  10 mg Oral TID PRN Curlene Labrum Readling, MD      . FLUoxetine (PROZAC) capsule 60 mg  60 mg Oral Daily Curlene Labrum Readling, MD   60 mg at 05/31/11 0836  . hydrOXYzine (ATARAX/VISTARIL) tablet 25 mg  25 mg Oral BID PRN Ronny Bacon, MD   25 mg at 05/31/11 1358  . magnesium hydroxide (MILK  OF MAGNESIA) suspension 30 mL  30 mL Oral Daily PRN Verne Spurr, PA-C      . nicotine (NICODERM CQ - dosed in mg/24 hours) patch 21 mg  21 mg Transdermal Daily Mike Craze, MD   21 mg at 05/31/11 0836  . traZODone (DESYREL) tablet 100 mg  100 mg Oral QHS Curlene Labrum Readling, MD   100 mg at 05/30/11 2206  . DISCONTD: acetaminophen (TYLENOL) tablet 650 mg  650 mg Oral Q6H PRN Ronny Bacon, MD      . DISCONTD: alum & mag hydroxide-simeth (MAALOX/MYLANTA) 200-200-20 MG/5ML suspension 30 mL  30 mL Oral Q4H PRN Ronny Bacon, MD      . DISCONTD: magnesium hydroxide (MILK OF MAGNESIA) suspension 30 mL  30 mL Oral Daily PRN Ronny Bacon, MD      . DISCONTD: traZODone (DESYREL) tablet 50 mg  50 mg Oral QHS PRN Verne Spurr, PA-C        Lab Results:  Results for orders placed during the hospital encounter of 05/30/11 (from the past 48 hour(s))  URINE RAPID DRUG SCREEN (HOSP PERFORMED)     Status: Abnormal   Collection Time   05/30/11  8:04 PM      Component Value Range Comment   Opiates NONE DETECTED  NONE DETECTED     Cocaine POSITIVE (*)  NONE DETECTED     Benzodiazepines NONE DETECTED  NONE DETECTED     Amphetamines NONE DETECTED  NONE DETECTED     Tetrahydrocannabinol POSITIVE (*) NONE DETECTED     Barbiturates NONE DETECTED  NONE DETECTED    CBC     Status: Normal   Collection Time   05/31/11  6:17 AM      Component Value Range Comment   WBC 8.3  4.0 - 10.5 (K/uL)    RBC 4.43  3.87 - 5.11 (MIL/uL)    Hemoglobin 14.0  12.0 - 15.0 (g/dL)    HCT 16.1  09.6 - 04.5 (%)    MCV 93.7  78.0 - 100.0 (fL)    MCH 31.6  26.0 - 34.0 (pg)    MCHC 33.7  30.0 - 36.0 (g/dL)    RDW 40.9  81.1 - 91.4 (%)    Platelets 242  150 - 400 (K/uL)   COMPREHENSIVE METABOLIC PANEL     Status: Abnormal   Collection Time   05/31/11  6:17 AM      Component Value Range Comment   Sodium 138  135 - 145 (mEq/L)    Potassium 3.7  3.5 - 5.1 (mEq/L)    Chloride 104  96 - 112 (mEq/L)    CO2 27  19 - 32 (mEq/L)     Glucose, Bld 96  70 - 99 (mg/dL)    BUN 10  6 - 23 (mg/dL)    Creatinine, Ser 7.82  0.50 - 1.10 (mg/dL)    Calcium 8.9  8.4 - 10.5 (mg/dL)    Total Protein 6.4  6.0 - 8.3 (g/dL)    Albumin 3.4 (*) 3.5 - 5.2 (g/dL)    AST 21  0 - 37 (U/L)    ALT 22  0 - 35 (U/L)    Alkaline Phosphatase 80  39 - 117 (U/L)    Total Bilirubin 0.1 (*) 0.3 - 1.2 (mg/dL)    GFR calc non Af Amer 83 (*) >90 (mL/min)    GFR calc Af Amer >90  >90 (mL/min)   ETHANOL     Status: Normal   Collection Time   05/31/11  6:17 AM      Component Value Range Comment   Alcohol, Ethyl (B) <11  0 - 11 (mg/dL)     Physical Findings: AIMS:  , ,  ,  ,    CIWA:    COWS:     Treatment Plan Summary: Daily contact with patient to assess and evaluate symptoms and progress in treatment Medication management  Plan: Try Tegretol 100 TID today then push to 200 TID tomorrow.  Will use PRN for tonight to help her get to sleep.  Will start Thorazine in AM after she has had some antiepileptic effect with Tegretol.  Will use the PRN;s of Librium protocol, but not use Librium taper and use Tegretol.  She has what seems to be bipolar and will use the Tegretol to treat her bipolar and detox her from the alcohol and help with her anxiety some too.  Arloa Prak 05/31/2011, 3:32 PM

## 2011-05-31 NOTE — Progress Notes (Signed)
Patient ID: [name], female   DOB: [birth year], 31 y.o.   MRN: [id number] Pt. Reports upon admission she was having SI r/t the depression. Client rates depression at "9" of 10. Currently denies SHI. Pt. Attends group and interacts with other clients. Staff will monitor q53min for safety.

## 2011-05-31 NOTE — Progress Notes (Signed)
Pt reported no success in her vistaril administered prior to the beginning of the shift. Pt furthers entails that this may be related to her cocaine and crack use that she failed to report during her initial walk-in assessment or initial unit admission assessment. PA informed and a UDS along with a pregnancy test and other labs were ordered for this pt. Pt was informed and was receptive to this information. Continued support and availability as needed has been extended to this pt. Pt safety remains with q23min checks.

## 2011-05-31 NOTE — Tx Team (Signed)
Interdisciplinary Treatment Plan Update (Adult)  Date:  05/31/2011  Time Reviewed:  10:46 AM   Progress in Treatment: Attending groups: Yes Participating in groups:  Yes Taking medication as prescribed: Yes Tolerating medication:  Yes Family/Significant other contact made:  Counselor will assess for appropriate contact Patient understands diagnosis:  Yes Discussing patient identified problems/goals with staff:  Yes Medical problems stabilized or resolved:  Yes Denies suicidal/homicidal ideation: Yes Issues/concerns per patient self-inventory:  None identified Other: N/A  New problem(s) identified: None Identified  Reason for Continuation of Hospitalization: Anxiety Depression Medication stabilization Suicidal ideation Withdrawal symptoms  Interventions implemented related to continuation of hospitalization: mood stabilization, medication monitoring and adjustment, group therapy and psycho education, safety checks q 15 mins  Additional comments: N/A  Estimated length of stay: 3-5 days  Discharge Plan: SW will assess for appropriate referrals.    New goal(s): N/A  Review of initial/current patient goals per problem list:    1.  Goal(s): Reduce depressive symptoms  Met:  No  Target date: by discharge  As evidenced by: Reducing depression from a 10 to a 3 as reported by pt. Pt ranks at a 10 today.  2.  Goal (s): Reduce/Eliminate suicidal ideation  Met:  No  Target date: by discharge  As evidenced by: pt reporting no SI.    3.  Goal(s): Reduce anxiety symptoms  Met:  No  Target date: by discharge  As evidenced by: Reduce anxiety from a 10 to a 3 as reported by pt. Pt ranks at a 10 today.    Attendees: Patient:     Family:     Physician:  Orson Aloe, MD  05/31/2011  10:46 AM   Nursing:   Manuela Schwartz, RN 05/31/2011 10:54 AM   Case Manager:  Reyes Ivan, LCSWA 05/31/2011  10:46 AM   Counselor:  Angus Palms, LCSW 05/31/2011  10:46 AM   Other:   Juline Patch, LCSW 05/31/2011  10:46 AM   Other: Omelia Blackwater, RN 05/31/2011  10:46 AM   Other:     Other:      Scribe for Treatment Team:   Carmina Miller, 05/31/2011 , 10:46 AM

## 2011-05-31 NOTE — Progress Notes (Signed)
Pt rates depression at a 10 and hopelessness at a 10. Pt attends groups and interacts well with peers and staff. Pt was also very open in group today. Pt was offered support and encouragement. Pt does report SI but does contract for safety. Pt is receptive to treatment and safety is maintained on unit.

## 2011-05-31 NOTE — Progress Notes (Signed)
[facility] Group Notes: (Counselor/Nursing/MHT/Case Management/Adjunct) 05/31/2011    11:00am Emotion Regulation  Type of Therapy:  Group Therapy  Participation Level:  Active  Participation Quality:  Attentive, Sharing, Appropriate    Affect:  Blunted  Cognitive:  Appropriate  Insight:  Good  Engagement in Group: Good  Engagement in Therapy:  Good  Modes of Intervention:  Support and Exploration  Summary of Progress/Problems: [name] processed her overwhelming emotions, stating that the usually overwhelm her when they pile on top of each other. For example, she identified that she was taught not to get angry growing up so when she feels mad she almost immediately experiences guilt too. [name] explored the physical responses that accompany her emotions and stated that strong emotions result in body pains that are very difficult for her to describe, but that just feel like painful weight of depression.  [name] 05/31/2011 12:52 PM

## 2011-05-31 NOTE — H&P (Signed)
Medical/psychiatric screening examination/treatment/procedure(s) were performed by non-physician practitioner and as supervising physician I was immediately available for consultation/collaboration.   I have seen and examined this patient and agree with this evaluation.  

## 2011-06-01 MED ORDER — METHOCARBAMOL 500 MG PO TABS
500.0000 mg | ORAL_TABLET | Freq: Three times a day (TID) | ORAL | Status: DC | PRN
  Administered 2011-06-01 – 2011-06-05 (×7): 500 mg via ORAL
  Filled 2011-06-01 (×7): qty 1

## 2011-06-01 MED ORDER — CARBAMAZEPINE 200 MG PO TABS
200.0000 mg | ORAL_TABLET | Freq: Three times a day (TID) | ORAL | Status: DC
  Administered 2011-06-01 – 2011-06-08 (×20): 200 mg via ORAL
  Filled 2011-06-01: qty 21
  Filled 2011-06-01 (×6): qty 1
  Filled 2011-06-01: qty 21
  Filled 2011-06-01 (×15): qty 1
  Filled 2011-06-01: qty 21
  Filled 2011-06-01: qty 1

## 2011-06-01 MED ORDER — DICYCLOMINE HCL 20 MG PO TABS: 20.0000 mg | ORAL_TABLET | ORAL | Status: AC | PRN

## 2011-06-01 MED ORDER — CHLORPROMAZINE HCL 50 MG PO TABS
50.0000 mg | ORAL_TABLET | Freq: Three times a day (TID) | ORAL | Status: DC
  Administered 2011-06-01 – 2011-06-05 (×12): 50 mg via ORAL
  Filled 2011-06-01 (×19): qty 1

## 2011-06-01 MED ORDER — NAPROXEN 500 MG PO TABS
500.0000 mg | ORAL_TABLET | Freq: Two times a day (BID) | ORAL | Status: AC | PRN
  Administered 2011-06-02 – 2011-06-06 (×9): 500 mg via ORAL
  Filled 2011-06-01 (×10): qty 1
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
## 2011-06-01 NOTE — Progress Notes (Signed)
Patient ID: [name], female   DOB: 1980-10-03, 31 y.o.   MRN: [id number] Pt. In day room playing cards, appears relaxed, but upon talking to writer verbalized, "that's just it when I'm around others and doing things I'm not as anxious." Pt. Currently says she had a rough day and is having a lot of anxiety. Pt. Denies SHI. Staff will monitor q74min for safety.

## 2011-06-01 NOTE — Progress Notes (Signed)
[facility] MD Progress Note  06/01/2011 9:37 PM  Diagnosis:  Axis I: Bipolar, Depressed, Generalized Anxiety Disorder and Substance Induced Mood Disorder  ADL's:  Intact  Sleep: Poor pt thinks she may have slept only 1 to 2 hours last night.  Appetite:  Good  Suicidal Ideation:  Pt has noted some suicidal thoughts, is able to contract for safety. Homicidal Ideation:  Denies adamantly any homicidal thoughts.  Mental Status Examination/Evaluation: Objective:  Appearance: Disheveled  Eye Contact::  Good  Speech:  Clear and Coherent  Volume:  Normal  Mood:  10 /10 on a scale of 1 is the best and 10 is the worst Reports this as being a horrible day emotionally.  She was reminded that withdrawing from drugs is not fun and that she should remember this. Anxiety: 10/10 on the same scale She felt better after taking the first dose of Thorazine for about 10 minutes.  She is agreeable with trying a higher dose of that.  Affect:  Congruent  Thought Process:  Coherent  Orientation:  Full  Thought Content:  WDL  Suicidal Thoughts:  Yes.  with intent/plan  Homicidal Thoughts:  No  Memory:  Immediate;   Good  Judgement:  Impaired  Insight:  Lacking  Psychomotor Activity:  Normal  Concentration:  Fair  Recall:  Fair  Akathisia:  No  Handed:  Right  AIMS (if indicated):     Assets:  Communication Skills Desire for Improvement  Sleep:  Number of Hours: 6.75    ROS: GI: some cramping will start the clonidine protocol for her apparent withdrawals from opiates.  UDS missing from admission.  MS: totally body aches which could be arising from detoxing from opiates.  Pt adds that she had been doing some Heroin.  Describes back anatomy in terms that she reported that she understood.  She could see how back muscle cramps could lead to further back pain and that stretching could be very helpful.  ENT: pt noted a nose bleed yesterday.  She asked if it could be related to her snorting Heroin.  She was  informed that it would be hard to absolutely tell. That sometimes it relates to the air being dry in the environment.  She does not have a significant history of many nose bleeds.  Neuro: no symptoms noted.   Vital Signs:Blood pressure 135/94, pulse 118, temperature 98.2 F (36.8 C), temperature source Oral, resp. rate 16, height 5\' 5"  (1.651 m), weight 111.812 kg (246 lb 8 oz), last menstrual period 05/30/2011, SpO2 96.00%. Current Medications: Current Facility-Administered Medications  Medication Dose Route Frequency Provider Last Rate Last Dose  . acetaminophen (TYLENOL) tablet 650 mg  650 mg Oral Q6H PRN Verne Spurr, PA-C   650 mg at 06/01/11 1211  . carbamazepine (TEGRETOL) tablet 200 mg  200 mg Oral TID Mike Craze, MD      . chlordiazePOXIDE (LIBRIUM) capsule 25 mg  25 mg Oral Q6H PRN Mike Craze, MD   25 mg at 06/01/11 1211  . chlorproMAZINE (THORAZINE) tablet 50 mg  50 mg Oral TID Mike Craze, MD      . dicyclomine (BENTYL) tablet 20 mg  20 mg Oral Q4H PRN Mike Craze, MD      . FLUoxetine (PROZAC) capsule 60 mg  60 mg Oral Daily Curlene Labrum Readling, MD   60 mg at 06/01/11 0858  . loperamide (IMODIUM) capsule 2-4 mg  2-4 mg Oral PRN Mike Craze, MD      .  magnesium hydroxide (MILK OF MAGNESIA) suspension 30 mL  30 mL Oral Daily PRN Verne Spurr, PA-C      . methocarbamol (ROBAXIN) tablet 500 mg  500 mg Oral Q8H PRN Mike Craze, MD      . mulitivitamin with minerals tablet 1 tablet  1 tablet Oral Daily Mike Craze, MD   1 tablet at 06/01/11 0859  . naproxen (NAPROSYN) tablet 500 mg  500 mg Oral BID PRN Mike Craze, MD      . nicotine (NICODERM CQ - dosed in mg/24 hours) patch 21 mg  21 mg Transdermal Daily Mike Craze, MD   21 mg at 06/01/11 0900  . ondansetron (ZOFRAN-ODT) disintegrating tablet 4 mg  4 mg Oral Q6H PRN Mike Craze, MD      . thiamine (VITAMIN B-1) tablet 100 mg  100 mg Oral Daily Mike Craze, MD   100 mg at 06/01/11 0859  . traZODone  (DESYREL) tablet 100 mg  100 mg Oral QHS Curlene Labrum Readling, MD   100 mg at 05/31/11 2147  . DISCONTD: carbamazepine (TEGRETOL) chewable tablet 100 mg  100 mg Oral TID Mike Craze, MD   100 mg at 06/01/11 1534  . DISCONTD: chlorproMAZINE (THORAZINE) tablet 25 mg  25 mg Oral TID Mike Craze, MD   25 mg at 06/01/11 1535  . DISCONTD: cyclobenzaprine (FLEXERIL) tablet 10 mg  10 mg Oral TID PRN Ronny Bacon, MD   10 mg at 06/01/11 0903    Lab Results:  Results for orders placed during the hospital encounter of 05/30/11 (from the past 48 hour(s))  CBC     Status: Normal   Collection Time   05/31/11  6:17 AM      Component Value Range Comment   WBC 8.3  4.0 - 10.5 (K/uL)    RBC 4.43  3.87 - 5.11 (MIL/uL)    Hemoglobin 14.0  12.0 - 15.0 (g/dL)    HCT 40.9  81.1 - 91.4 (%)    MCV 93.7  78.0 - 100.0 (fL)    MCH 31.6  26.0 - 34.0 (pg)    MCHC 33.7  30.0 - 36.0 (g/dL)    RDW 78.2  95.6 - 21.3 (%)    Platelets 242  150 - 400 (K/uL)   COMPREHENSIVE METABOLIC PANEL     Status: Abnormal   Collection Time   05/31/11  6:17 AM      Component Value Range Comment   Sodium 138  135 - 145 (mEq/L)    Potassium 3.7  3.5 - 5.1 (mEq/L)    Chloride 104  96 - 112 (mEq/L)    CO2 27  19 - 32 (mEq/L)    Glucose, Bld 96  70 - 99 (mg/dL)    BUN 10  6 - 23 (mg/dL)    Creatinine, Ser 0.86  0.50 - 1.10 (mg/dL)    Calcium 8.9  8.4 - 10.5 (mg/dL)    Total Protein 6.4  6.0 - 8.3 (g/dL)    Albumin 3.4 (*) 3.5 - 5.2 (g/dL)    AST 21  0 - 37 (U/L)    ALT 22  0 - 35 (U/L)    Alkaline Phosphatase 80  39 - 117 (U/L)    Total Bilirubin 0.1 (*) 0.3 - 1.2 (mg/dL)    GFR calc non Af Amer 83 (*) >90 (mL/min)    GFR calc Af Amer >90  >90 (mL/min)   ETHANOL  Status: Normal   Collection Time   05/31/11  6:17 AM      Component Value Range Comment   Alcohol, Ethyl (B) <11  0 - 11 (mg/dL)   PREGNANCY, URINE     Status: Normal   Collection Time   05/31/11  3:02 PM      Component Value Range Comment   Preg Test, Ur  NEGATIVE  NEGATIVE    URINALYSIS, ROUTINE W REFLEX MICROSCOPIC     Status: Abnormal   Collection Time   05/31/11  3:02 PM      Component Value Range Comment   Color, Urine YELLOW  YELLOW     APPearance CLOUDY (*) CLEAR     Specific Gravity, Urine 1.022  1.005 - 1.030     pH 7.0  5.0 - 8.0     Glucose, UA NEGATIVE  NEGATIVE (mg/dL)    Hgb urine dipstick TRACE (*) NEGATIVE     Bilirubin Urine NEGATIVE  NEGATIVE     Ketones, ur NEGATIVE  NEGATIVE (mg/dL)    Protein, ur NEGATIVE  NEGATIVE (mg/dL)    Urobilinogen, UA 0.2  0.0 - 1.0 (mg/dL)    Nitrite NEGATIVE  NEGATIVE     Leukocytes, UA SMALL (*) NEGATIVE    URINE MICROSCOPIC-ADD ON     Status: Normal   Collection Time   05/31/11  3:02 PM      Component Value Range Comment   Squamous Epithelial / LPF RARE  RARE     WBC, UA 3-6  <3 (WBC/hpf)    RBC / HPF 0-2  <3 (RBC/hpf)    Bacteria, UA RARE  RARE      Physical Findings: AIMS:  , ,  ,  ,    CIWA:  CIWA-Ar Total: 0  COWS:     Treatment Plan Summary: Daily contact with patient to assess and evaluate symptoms and progress in treatment Medication management  Discussion/Plan: It seems that pt is withdrawing from opiates as her body and her emotions are telling the story with total body aches, stomach cramping, and emotional lability.  Will start clonidine protocol for that.    Reviewed back anatomy and explained how stretching could be very helpful for management of back muscle cramps.  She seemed to voice an understanding of what was reviewed with her.  WIll switch to Robaxin for her back cramps.  Increase Thorazine for better management of her anxiety.  [name] 06/01/2011, 9:37 PM

## 2011-06-01 NOTE — Progress Notes (Addendum)
Patient's self inventory sheet, has poor sleep, good appetite, low energy level, good attention span.   Rated depression and hopelessness #10.  Has experienced cravings, agitation.   SI, contracts for safety.  Had headache in past 24 hours.  Worst pain #7.  Does not know how to take better care of herself after discharge.   Feels very sad/agitated, cannot explain why.  Does not have discharge plans.  No problems taking meds after discharge.  Wants to go to rehab, does not want to go home. Stated she had a bad dream last night that has upset her today. Patient has attended groups.  Patient has been cooperative, pleasant and alert. Patient to program on [address] for AA/NA groups per Dr. Tilman Neat instructions.

## 2011-06-01 NOTE — Progress Notes (Signed)
Pt attended discharge planning group and actively participated.  Pt presents with flat affect and depressed mood.  Pt ranks depression and anxiety at a 10 today.  Pt denies SI, stating not right now.  Pt states she is very sleepy today.  Pt discussed not feeling like she will ever be ready to d/c from here and go straight home.  Pt states that she doesn't feel safe to be home yet.  Pt requesting further inpatient treatment for mental health.  SW explained that there aren't any inpatient long term facilities for mental health.  SW explained that pt has a lot of substance abuse history and pt could go for further treatment for this.  Pt was open to this information and agreed to go for further treatment.  SW will assess for appropriate referrals.  No further needs voiced by pt at this time.    Reyes Ivan, LCSWA 06/01/2011  10:45 AM

## 2011-06-02 DIAGNOSIS — F259 Schizoaffective disorder, unspecified: Secondary | ICD-10-CM

## 2011-06-02 MED ORDER — CITALOPRAM HYDROBROMIDE 10 MG/5ML PO SOLN: 10.0000 mg | Freq: Every day | ORAL | Status: DC

## 2011-06-02 MED ORDER — LIDOCAINE 5 % EX PTCH
1.0000 | MEDICATED_PATCH | CUTANEOUS | Status: DC
  Administered 2011-06-03 – 2011-06-07 (×5): 1 via TRANSDERMAL
  Filled 2011-06-02 (×2): qty 1
  Filled 2011-06-02: qty 7
  Filled 2011-06-02 (×3): qty 1

## 2011-06-02 MED ORDER — CITALOPRAM HYDROBROMIDE 10 MG PO TABS
10.0000 mg | ORAL_TABLET | Freq: Every day | ORAL | Status: DC
  Filled 2011-06-02: qty 1

## 2011-06-02 MED ORDER — DICLOFENAC SODIUM 1 % TD GEL
Freq: Four times a day (QID) | TRANSDERMAL | Status: AC
  Administered 2011-06-02: 19:00:00 via TOPICAL
  Administered 2011-06-02: 1 via TOPICAL
  Administered 2011-06-03 (×2): via TOPICAL
  Filled 2011-06-02: qty 100

## 2011-06-02 MED ORDER — CLONIDINE HCL 0.2 MG/24HR TD PTWK
0.2000 mg | MEDICATED_PATCH | TRANSDERMAL | Status: DC
  Administered 2011-06-02: 0.2 mg via TRANSDERMAL
  Filled 2011-06-02 (×2): qty 1

## 2011-06-02 NOTE — Progress Notes (Signed)
[facility] MD Progress Note  06/02/2011 9:23 PM  Diagnosis:  Axis I: Bipolar, Depressed, Generalized Anxiety Disorder and Substance Induced Mood Disorder  ADL's:  Intact  Sleep: Poor , but she may have slept more than she initially thought she did  Appetite:  Good  Suicidal Ideation:  Pt has noted some suicidal thoughts, is able to contract for safety. Homicidal Ideation:  Denies adamantly any homicidal thoughts.  Mental Status Examination/Evaluation: Objective:  Appearance: Disheveled  Eye Contact::  Good  Speech:  Clear and Coherent  Volume:  Normal  Mood:  9 /10 on a scale of 1 is the best and 10 is the worst, notes some improvement in her mood Anxiety: 100/10 on the same scale She is on the higher dose of Thorazine but is still quite anxious.  Will add the clonidine patch for what seems to be withdrawal symptoms.  Affect:  Congruent  Thought Process:  Coherent  Orientation:  Full  Thought Content:  WDL  Suicidal Thoughts:  Yes.  without intent/plan  Homicidal Thoughts:  No  Memory:  Immediate;   Good  Judgement:  Impaired  Insight:  Lacking  Psychomotor Activity:  Normal  Concentration:  Fair  Recall:  Fair  Akathisia:  No  Handed:  Right  AIMS (if indicated):     Assets:  Communication Skills Desire for Improvement  Sleep:  Number of Hours: 6.5    ROS: GI: some cramping will start the clonidine patch for her.  This was left off from yesterday.  MS: totally body aches which could be arising from detoxing from opiates.  Will add Voltaren gel for her back and then transition to Lidoderm to see which works the best for her as they have 2 different sorts of action.  Neuro: no symptoms noted, except headache.   Vital Signs:Blood pressure 110/76, pulse 106, temperature 97.8 F (36.6 C), temperature source Oral, resp. rate 16, height 5\' 5"  (1.651 m), weight 111.812 kg (246 lb 8 oz), last menstrual period 05/30/2011, SpO2 96.00%. Current Medications: Current  Facility-Administered Medications  Medication Dose Route Frequency Provider Last Rate Last Dose  . acetaminophen (TYLENOL) tablet 650 mg  650 mg Oral Q6H PRN Verne Spurr, PA-C   650 mg at 06/02/11 1544  . carbamazepine (TEGRETOL) tablet 200 mg  200 mg Oral TID Mike Craze, MD   200 mg at 06/02/11 1542  . chlordiazePOXIDE (LIBRIUM) capsule 25 mg  25 mg Oral Q6H PRN Mike Craze, MD   25 mg at 06/02/11 1544  . chlorproMAZINE (THORAZINE) tablet 50 mg  50 mg Oral TID Mike Craze, MD   50 mg at 06/02/11 1542  . citalopram (CELEXA) tablet 10 mg  10 mg Oral Daily Mike Craze, MD      . cloNIDine (CATAPRES - Dosed in mg/24 hr) patch 0.2 mg  0.2 mg Transdermal Weekly Mike Craze, MD   0.2 mg at 06/02/11 1956  . diclofenac sodium (VOLTAREN) 1 % transdermal gel   Topical QID Mike Craze, MD      . dicyclomine (BENTYL) tablet 20 mg  20 mg Oral Q4H PRN Mike Craze, MD      . lidocaine (LIDODERM) 5 % 1 patch  1 patch Transdermal Q24H Mike Craze, MD      . loperamide (IMODIUM) capsule 2-4 mg  2-4 mg Oral PRN Mike Craze, MD      . magnesium hydroxide (MILK OF MAGNESIA) suspension 30 mL  30 mL Oral  Daily PRN Verne Spurr, PA-C      . methocarbamol (ROBAXIN) tablet 500 mg  500 mg Oral Q8H PRN Mike Craze, MD   500 mg at 06/02/11 1610  . mulitivitamin with minerals tablet 1 tablet  1 tablet Oral Daily Mike Craze, MD   1 tablet at 06/02/11 0825  . naproxen (NAPROSYN) tablet 500 mg  500 mg Oral BID PRN Mike Craze, MD   500 mg at 06/02/11 1838  . nicotine (NICODERM CQ - dosed in mg/24 hours) patch 21 mg  21 mg Transdermal Daily Mike Craze, MD   21 mg at 06/02/11 0800  . ondansetron (ZOFRAN-ODT) disintegrating tablet 4 mg  4 mg Oral Q6H PRN Mike Craze, MD      . thiamine (VITAMIN B-1) tablet 100 mg  100 mg Oral Daily Mike Craze, MD   100 mg at 06/02/11 0825  . traZODone (DESYREL) tablet 100 mg  100 mg Oral QHS Curlene Labrum Readling, MD   100 mg at 06/01/11 2221  .  DISCONTD: citalopram (CELEXA) 10 MG/5ML suspension 10 mg  10 mg Oral Daily Mike Craze, MD      . DISCONTD: FLUoxetine (PROZAC) capsule 60 mg  60 mg Oral Daily Curlene Labrum Readling, MD   60 mg at 06/02/11 0825    Lab Results:  No results found for this or any previous visit (from the past 48 hour(s)).  Physical Findings: AIMS:  , ,  ,  ,    CIWA:  CIWA-Ar Total: 0  COWS:     Treatment Plan Summary: Daily contact with patient to assess and evaluate symptoms and progress in treatment Medication management  Discussion/Plan: It seems that pt is withdrawing from opiates as her body and her emotions are telling the story with total body aches, stomach cramping, and emotional lability.  Will add the clonidine patch for that.  This was inadvertently left off from yesterday.    Discussed using Voltaren and Lidoderm for her back.  Will get Tegretol level Sun AM.  She feels that the Prozac may not be helping her depression and that her depression is really bad.  She desires to switch to something else.  Will stop that and on Sun will start Celexa.  Roy Tokarz 06/02/2011, 9:23 PM

## 2011-06-02 NOTE — Progress Notes (Signed)
Recreation Therapy Notes  06/02/2011         Time: 1415      Group Topic/Focus: The focus of this group is on discussing various aspects of wellness, balancing those aspects and exploring ways to increase the ability to experience wellness.  Participation Level: Active  Participation Quality: Appropriate  Affect: Appropriate  Cognitive: Oriented   Additional Comments: Patient complaining of feeling dizzy, given water and allowed to rest as needed.   [name] 06/02/2011 2:39 PM

## 2011-06-02 NOTE — Progress Notes (Signed)
06/02/2011 1800 Nursing D [name] ahs had a hard time today....stating she just feels" awful" with withdrawal complaints...body aches and discomforts. She has attended her groups. She is pleasant and cooperative, but sad, and depressed. She makes brief eye contact. A She completed her slef inventory and on it she wrote she has " off and on SI" but contracted with this nurse to stay safe. She rated her depression and hopelessnesss " 9 /8 /". R Safety is maintaiend and POC incldues maintenance of therpaeuti9c relationship already establsiehd PD RN [facility]

## 2011-06-02 NOTE — Progress Notes (Signed)
[facility] Group Notes: (Counselor/Nursing/MHT/Case Management/Adjunct) 06/02/2011   @11 :00am Preventing Relapse  Type of Therapy:  Group Therapy  Participation Level:  Active  Participation Quality:  Attentive, Appropriate, Sharing   Affect:  Blunted  Cognitive:  Appropriate  Insight:  Good  Engagement in Group: Good  Engagement in Therapy:  Good  Modes of Intervention:  Support and Exploration  Summary of Progress/Problems: [name] explored her own behaviors and the behaviors of others that might contribute to her relapsing into suicidal thoughts. She stated that thoughts about being tired of dealing with life and hopelessness are two big triggers to her. Also processed with others situations that put her in danger of relapse, such as trying to make everyone else happy, and setting herself up for failure. [name] was also able to identify times when she has dealt with uncontrollable events in her life well and how she hopes to change her perspective on life.   [name] 06/02/2011 12:23 PM

## 2011-06-02 NOTE — Progress Notes (Signed)
Pt attended discharge planning group and actively participated.  Pt presents with flat affect and depressed mood.  Pt ranks depression at a 9 and anxiety at a 10 today.  Pt reports on and off SI but contracts for safety.  Pt states that she still feels really depressed but is able to have good moments, and then gets depressed again.  Pt is set up to go to [facility] on Thursday.  Pt continues to discuss not feeling ready to d/c directly home but wanting to go for further treatment.  No further needs voiced by pt at this time.  Safety planning and suicide prevention discussed.     Reyes Ivan, LCSWA 06/02/2011  9:59 AM

## 2011-06-02 NOTE — Progress Notes (Signed)
[facility] Group Notes: (Counselor/Nursing/MHT/Case Management/Adjunct) 06/02/2011   @1 :15pm [facility]  Type of Therapy:  Group Therapy  Participation Level:  Active  Participation Quality:  Attentive, Sharing, Appropriate   Affect:  Appropriate  Cognitive:  Appropriate  Insight:  Good  Engagement in Group: Good  Engagement in Therapy:  Good  Modes of Intervention:  Support and Exploration  Summary of Progress/Problems: [name] was very engaged in discussion of programs and services provided by [facility]. He expressed interest in the services.  [name] 06/02/2011 3:00 PM

## 2011-06-03 MED ORDER — CITALOPRAM HYDROBROMIDE 10 MG PO TABS
10.0000 mg | ORAL_TABLET | Freq: Every day | ORAL | Status: DC
  Administered 2011-06-03 – 2011-06-08 (×6): 10 mg via ORAL
  Filled 2011-06-03: qty 1
  Filled 2011-06-03: qty 7
  Filled 2011-06-03 (×6): qty 1

## 2011-06-03 MED ORDER — CLONIDINE HCL 0.1 MG PO TABS
0.1000 mg | ORAL_TABLET | Freq: Four times a day (QID) | ORAL | Status: DC | PRN
  Administered 2011-06-03 – 2011-06-06 (×8): 0.1 mg via ORAL
  Filled 2011-06-03 (×5): qty 1

## 2011-06-03 NOTE — Progress Notes (Signed)
[facility] Group Notes:  (Counselor/Nursing/MHT/Case Management/Adjunct)  06/03/2011 1315  Type of Therapy:  Group Therapy  Modes of Intervention:  Clarification, Problem-solving, Socialization and Support  Summary of Progress/Problems: Pt. attended and, however she excused herself because she stated that she was unable to stay awake for the entire group.  [name], [name] 06/03/2011, 3:37 PM.

## 2011-06-03 NOTE — Progress Notes (Signed)
[facility] Group Notes:  (Counselor/Nursing/MHT/Case Management/Adjunct)  06/03/2011 0830  Type of Therapy:  Discharge Planning  Participation Level:  Active  Summary of Progress/Problems: Pt states that she would like additional treatment after her 28 day treatment at [facility]. Pt states that she needs the treatment for substance use but feels she will need treatment to deal with her depression. Pt states that she anticipates her depression to be high after discharging from [facility] because she will be losing her children. Pt states she is unsure about how stable she will be after tx at [facility].   [name], [name] 06/03/2011, 10:16 AM

## 2011-06-03 NOTE — Progress Notes (Signed)
[facility] Group Notes:  (Counselor/Nursing/MHT/Case Management/Adjunct)  06/03/2011 5:53 PM  Type of Therapy:  Psychoeducational Skills  Participation Level:  Active  Participation Quality:  Appropriate  Affect:  Appropriate  Cognitive:  Appropriate  Insight:  Limited  Engagement in Group:  Good  Engagement in Therapy:  Good  Modes of Intervention:  Activity, Clarification, Education and Problem-solving  Summary of Progress/Problems: [name] participated in group activity where they were asked to take a quiz on the five"love languages".  The group processed the importance of communication to get their needs met. They specifically talked about which love language is their primary one, what that means to them and how they can communicate that need to others.   Alyson Reedy 06/03/2011, 5:53 PM

## 2011-06-03 NOTE — Progress Notes (Signed)
  [name] is a 31 y.o. female [id number] 1980/09/21  05/30/2011 Principal Problem:  *Schizoaffective disorder, bipolar type Active Problems:  Psychoactive substance-induced organic mood disorder   Mental Status: Alert and oriented mood is anxious from continued withdrawal and depressed. Denies SI/HI/AVH.  Subjective/Objective: Thinks the depression and anxiety may be slightly better would like to start the Celexa as soon as possible. Asks if she can have some po clonidine with the patch  and isn't sleeping well yet.    Filed Vitals:   06/03/11 0701  BP: 108/73  Pulse: 101  Temp:   Resp: 20    Lab Results:   BMET    Component Value Date/Time   NA 138 05/31/2011 0617   K 3.7 05/31/2011 0617   CL 104 05/31/2011 0617   CO2 27 05/31/2011 0617   GLUCOSE 96 05/31/2011 0617   BUN 10 05/31/2011 0617   CREATININE 0.92 05/31/2011 0617   CALCIUM 8.9 05/31/2011 0617   GFRNONAA 83* 05/31/2011 0617   GFRAA >90 05/31/2011 0617    Medications:  Scheduled:     . carbamazepine  200 mg Oral TID  . chlorproMAZINE  50 mg Oral TID  . citalopram  10 mg Oral Daily  . cloNIDine  0.2 mg Transdermal Weekly  . diclofenac sodium   Topical QID  . lidocaine  1 patch Transdermal Q24H  . mulitivitamin with minerals  1 tablet Oral Daily  . nicotine  21 mg Transdermal Daily  . thiamine  100 mg Oral Daily  . traZODone  100 mg Oral QHS  . DISCONTD: citalopram  10 mg Oral Daily  . 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    [name],[name]. 06/03/2011

## 2011-06-03 NOTE — Progress Notes (Signed)
06/03/2011 Nursing  1700 D [name] remains  uncomfortable, still, asking for meds to help her with her uncomfortable feelings . She attends her groups. She is visibly anxious, although she is seen interacting with her peers often throughout the day. A She is compliant with her meds. R Safety is maintaiend and PO Cinludes fostering therpaeutic relationship PD RN [facility]

## 2011-06-03 NOTE — Progress Notes (Signed)
Patient ID: [name], female   DOB: 12-12-1980, 31 y.o.   MRN: [id number] Pt. denies lethality and A/V/H's or other problems, but has a sad to blunted affect with a social smile.  Pt. took her meds without a problem and interacts appropriately with staff and peers.

## 2011-06-04 LAB — URINALYSIS, ROUTINE W REFLEX MICROSCOPIC
Hgb urine dipstick: NEGATIVE
Protein, ur: NEGATIVE mg/dL
Urobilinogen, UA: 0.2 mg/dL (ref 0.0–1.0)

## 2011-06-04 LAB — URINE MICROSCOPIC-ADD ON

## 2011-06-04 NOTE — Progress Notes (Signed)
[facility] Group Notes:  (Counselor/Nursing/MHT/Case Management/Adjunct)  06/04/2011 0830  Type of Therapy:  Discharge Planning  Participation Level:  Active  Summary of Progress/Problems:  Pt. attended and participated in aftercare planning group. [facility] support group information was given as well as information on suicide prevention information, warning signs to look for with suicide and crisis line numbers to use. Pt stated that she is not feeling good now that the substances she was taking before being admitted are wearing off. Pt states that she used drugs to cover up and forget about her depression but now the depression is increasing. Pt states she is having SI on and off and contracted for safety.   [name], [name] 06/04/2011, 11:52 AM
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
## 2011-06-04 NOTE — Progress Notes (Signed)
[facility] Group Notes:  (Counselor/Nursing/MHT/Case Management/Adjunct)  06/04/2011 1315  Type of Therapy:  Group Therapy  Participation Level:  Did Not Attend  [name], [name] 06/04/2011, 4:32 PM 

## 2011-06-04 NOTE — Progress Notes (Signed)
Patient ID: [name], female   DOB: 10/01/1980, 31 y.o.   MRN: [id number]   Pt has been very flat and depressed on the unit. Pt reported that she was having some withdrawals and requested prn medication. Pt was given Robaxin and Clonidine. Pt reported that she was very shaky and very was anxious. Pt reported being negative SI/HI, no AH/VH noted. Pt reported on self inventory sheet that she was very depressed and very hopeless. Pt did report that sometimes she has thoughts of wanting to hurt self, however contracts for safety.

## 2011-06-04 NOTE — Progress Notes (Signed)
[facility] Family/Significant Other Suicide Prevention Education  Suicide Prevention Education:  Education Completed; [name] Peter-[phone] has been identified by the patient as the family member/significant other with whom the patient will be residing, and identified as the person(s) who will aid the patient in the event of a mental health crisis (suicidal ideations/suicide attempt).  With written consent from the patient, the family member/significant other has been provided the following suicide prevention education, prior to the and/or following the discharge of the patient.  The suicide prevention education provided includes the following:  Suicide risk factors  Suicide prevention and interventions  National Suicide Hotline telephone number  [facility] assessment telephone number  [facility] Emergency Assistance 911  [facility] and/or Residential Mobile Crisis Unit telephone number  Request made of family/significant other to:  Remove weapons (e.g., guns, rifles, knives), all items previously/currently identified as safety concern.  Pt.'s mother states the pt. has no weapons. Pt.'s mother does not live with the pt.  Remove drugs/medications (over-the-counter, prescriptions, illicit drugs), all items previously/currently identified as a safety concern. Pt.'s mother had no concerns.  Pt.'s mother stated that the pt. Came to [facility] for depression and anxiety.  The family member/significant other verbalizes understanding of the suicide prevention education information provided.  The family member/significant other agrees to remove the items of safety concern listed above.  [name] 06/04/2011, 8:41 AM

## 2011-06-04 NOTE — Progress Notes (Signed)
Found pt asleep in bed at start of shift. Awoke pt and encouraged her to join evening group which she did. Pt has complained of pain of an 8 all evening with a slight improvement to a 6 after being medicated. She has a flat affect and looked fatigued much of the evening. She is endorsing VH and reports "seeing things out of the corner of my eye." Pt given reassurance and support. Medicated per orders. She denies SI/HI/AH and remains safe. Lawrence Marseilles

## 2011-06-05 MED ORDER — ACAMPROSATE CALCIUM 333 MG PO TBEC
666.0000 mg | DELAYED_RELEASE_TABLET | Freq: Three times a day (TID) | ORAL | Status: DC
  Administered 2011-06-06 – 2011-06-08 (×7): 666 mg via ORAL
  Filled 2011-06-05: qty 42
  Filled 2011-06-05: qty 2
  Filled 2011-06-05 (×2): qty 42
  Filled 2011-06-05 (×7): qty 2

## 2011-06-05 MED ORDER — METHOCARBAMOL 500 MG PO TABS
1000.0000 mg | ORAL_TABLET | Freq: Three times a day (TID) | ORAL | Status: AC | PRN
  Administered 2011-06-05 – 2011-06-06 (×2): 1000 mg via ORAL
  Filled 2011-06-05 (×2): qty 1
  Filled 2011-06-05: qty 2

## 2011-06-05 MED ORDER — TRAZODONE HCL 50 MG PO TABS
150.0000 mg | ORAL_TABLET | Freq: Every day | ORAL | Status: DC
  Administered 2011-06-05 – 2011-06-07 (×3): 150 mg via ORAL
  Filled 2011-06-05 (×2): qty 3
  Filled 2011-06-05: qty 21
  Filled 2011-06-05 (×3): qty 1

## 2011-06-05 MED ORDER — TRAZODONE HCL 50 MG PO TABS: 50.0000 mg | ORAL_TABLET | Freq: Every evening | ORAL | Status: DC | PRN

## 2011-06-05 MED ORDER — CHLORPROMAZINE HCL 50 MG PO TABS
75.0000 mg | ORAL_TABLET | Freq: Three times a day (TID) | ORAL | Status: DC
  Administered 2011-06-05 – 2011-06-06 (×2): 75 mg via ORAL
  Filled 2011-06-05 (×6): qty 1

## 2011-06-05 MED ORDER — PANTOPRAZOLE SODIUM 20 MG PO TBEC
20.0000 mg | DELAYED_RELEASE_TABLET | Freq: Two times a day (BID) | ORAL | Status: DC
  Administered 2011-06-05 – 2011-06-08 (×6): 20 mg via ORAL
  Filled 2011-06-05 (×3): qty 1
  Filled 2011-06-05: qty 14
  Filled 2011-06-05 (×3): qty 1
  Filled 2011-06-05: qty 14
  Filled 2011-06-05: qty 1

## 2011-06-05 NOTE — Progress Notes (Addendum)
[facility] Group Notes: (Counselor/Nursing/MHT/Case Management/Adjunct) 06/05/2011   @  11:00am Overcoming Obstacles to Wellness   Type of Therapy:  Group Therapy  Participation Level:  Limited  Participation Quality: Active, Appropriate    Affect:  Blunted  Cognitive:  Appropriate  Insight:  Limited  Engagement in Group: Good  Engagement in Therapy:  Limited  Modes of Intervention:  Support and Exploration  Summary of Progress/Problems: [name] was attentive and shared briefly about her idea of happiness, stating that it is being sober and clean while spending time with her kids. She stated that she doesn't want to use anymore and that he believes her life will be better when she is not.   [name] 06/05/2011 2:57 PM     [facility] Group Notes: (Counselor/Nursing/MHT/Case Management/Adjunct) 06/05/2011   @1 :15pm Music and Mood   Type of Therapy:  Group Therapy  Participation Level:  None  Participation Quality:  Attentive   Affect:  Irritable  Cognitive:  Appropriate  Insight:  None  Engagement in Group: None  Engagement in Therapy:  None  Modes of Intervention:  Support and Exploration  Summary of Progress/Problems: [name]  was attentive but not engaged in group process    [name] 06/05/2011 3:50 PM

## 2011-06-05 NOTE — Progress Notes (Signed)
[facility] MD Progress Note  06/05/2011 6:41 PM  Diagnosis:  Axis I: Bipolar, Depressed, Generalized Anxiety Disorder and Substance Induced Mood Disorder  ADL's:  Intact  Sleep: Poor , but she may have slept more than she initially thought she did  Appetite:  Good  Suicidal Ideation:  Pt has noted some suicidal thoughts, is able to contract for safety. Homicidal Ideation:  Denies adamantly any homicidal thoughts.  Mental Status Examination/Evaluation: Objective:  Appearance: Disheveled  Eye Contact::  Good  Speech:  Clear and Coherent  Volume:  Normal  Mood:  9 /10 on a scale of 1 is the best and 10 is the worst, notes some improvement in her mood Anxiety: 100/10 on the same scale She is on the higher dose of Thorazine but is still quite anxious.  Will add the clonidine patch for what seems to be withdrawal symptoms.  Affect:  Congruent  Thought Process:  Coherent  Orientation:  Full  Thought Content:  WDL  Suicidal Thoughts:  Yes.  without intent/plan  Homicidal Thoughts:  No  Memory:  Immediate;   Good  Judgement:  Impaired  Insight:  Lacking  Psychomotor Activity:  Normal  Concentration:  Fair  Recall:  Fair  Akathisia:  No  Handed:  Right  AIMS (if indicated):     Assets:  Communication Skills Desire for Improvement  Sleep:  Number of Hours: 6.5    ROS: GI: some cramping and nausea asd well as heartburn.  Will order Protonix for that.  MS: totally body aches which could be arising from detoxing from opiates.  Will push Robaxin and increase Throazine for anxiety which amplifies the pain sensations.  Neuro: no symptoms noted.  Vital Signs:Blood pressure 130/72, pulse 86, temperature 97 F (36.1 C), temperature source Oral, resp. rate 16, height 5\' 5"  (1.651 m), weight 111.812 kg (246 lb 8 oz), last menstrual period 05/30/2011, SpO2 96.00%. Current Medications: Current Facility-Administered Medications  Medication Dose Route Frequency Provider Last Rate Last Dose  .  acamprosate (CAMPRAL) tablet 666 mg  666 mg Oral TID WC Mike Craze, MD      . acetaminophen (TYLENOL) tablet 650 mg  650 mg Oral Q6H PRN Verne Spurr, PA-C   650 mg at 06/02/11 1544  . carbamazepine (TEGRETOL) tablet 200 mg  200 mg Oral TID Mike Craze, MD   200 mg at 06/05/11 1512  . chlorproMAZINE (THORAZINE) tablet 75 mg  75 mg Oral TID Mike Craze, MD      . citalopram (CELEXA) tablet 10 mg  10 mg Oral Daily [name]. Adams, PA   10 mg at 06/05/11 0829  . cloNIDine (CATAPRES - Dosed in mg/24 hr) patch 0.2 mg  0.2 mg Transdermal Weekly Mike Craze, MD   0.2 mg at 06/02/11 1956  . cloNIDine (CATAPRES) tablet 0.1 mg  0.1 mg Oral Q6H PRN [name]. Adams, PA   0.1 mg at 06/05/11 1555  . dicyclomine (BENTYL) tablet 20 mg  20 mg Oral Q4H PRN Mike Craze, MD      . lidocaine (LIDODERM) 5 % 1 patch  1 patch Transdermal Q24H Mike Craze, MD   1 patch at 06/04/11 2108  . magnesium hydroxide (MILK OF MAGNESIA) suspension 30 mL  30 mL Oral Daily PRN Verne Spurr, PA-C      . methocarbamol (ROBAXIN) tablet 1,000 mg  1,000 mg Oral Q8H PRN Mike Craze, MD      . mulitivitamin with minerals tablet 1  tablet  1 tablet Oral Daily Mike Craze, MD   1 tablet at 06/05/11 0830  . naproxen (NAPROSYN) tablet 500 mg  500 mg Oral BID PRN Mike Craze, MD   500 mg at 06/05/11 1555  . nicotine (NICODERM CQ - dosed in mg/24 hours) patch 21 mg  21 mg Transdermal Daily Mike Craze, MD   21 mg at 06/05/11 0617  . pantoprazole (PROTONIX) EC tablet 20 mg  20 mg Oral BID AC Mike Craze, MD      . thiamine (VITAMIN B-1) tablet 100 mg  100 mg Oral Daily Mike Craze, MD   100 mg at 06/05/11 0830  . traZODone (DESYREL) tablet 150 mg  150 mg Oral QHS Mike Craze, MD      . traZODone (DESYREL) tablet 50 mg  50 mg Oral QHS PRN Mike Craze, MD      . DISCONTD: chlorproMAZINE (THORAZINE) tablet 50 mg  50 mg Oral TID Mike Craze, MD   50 mg at 06/05/11 1512  . DISCONTD: methocarbamol (ROBAXIN)  tablet 500 mg  500 mg Oral Q8H PRN Mike Craze, MD   500 mg at 06/05/11 0920  . DISCONTD: traZODone (DESYREL) tablet 100 mg  100 mg Oral QHS Ronny Bacon, MD   100 mg at 06/04/11 2108    Lab Results:  Results for orders placed during the hospital encounter of 05/30/11 (from the past 48 hour(s))  CARBAMAZEPINE LEVEL, TOTAL     Status: Abnormal   Collection Time   06/04/11  7:04 AM      Component Value Range Comment   Carbamazepine Lvl 12.3 (*) 4.0 - 12.0 (ug/mL)   URINALYSIS, ROUTINE W REFLEX MICROSCOPIC     Status: Abnormal   Collection Time   06/04/11  8:00 PM      Component Value Range Comment   Color, Urine YELLOW  YELLOW     APPearance CLOUDY (*) CLEAR     Specific Gravity, Urine 1.020  1.005 - 1.030     pH 6.0  5.0 - 8.0     Glucose, UA >1000 (*) NEGATIVE (mg/dL)    Hgb urine dipstick NEGATIVE  NEGATIVE     Bilirubin Urine NEGATIVE  NEGATIVE     Ketones, ur NEGATIVE  NEGATIVE (mg/dL)    Protein, ur NEGATIVE  NEGATIVE (mg/dL)    Urobilinogen, UA 0.2  0.0 - 1.0 (mg/dL)    Nitrite NEGATIVE  NEGATIVE     Leukocytes, UA SMALL (*) NEGATIVE    URINE MICROSCOPIC-ADD ON     Status: Abnormal   Collection Time   06/04/11  8:00 PM      Component Value Range Comment   Squamous Epithelial / LPF MANY (*) RARE     WBC, UA 3-6  <3 (WBC/hpf)    RBC / HPF 0-2  <3 (RBC/hpf)    Bacteria, UA FEW (*) RARE      Physical Findings: AIMS:  , ,  ,  ,    CIWA:  CIWA-Ar Total: 5  COWS:     Treatment Plan Summary: Daily contact with patient to assess and evaluate symptoms and progress in treatment Medication management  Discussion/Plan: She was challenged with the concept that she may not see the age 31, if she does not make some big and lasting changes in her lifestyle.  Drew a tombstone with [employer] November 1968.07.05 - today on one side and 180 mtgs in 90 days Get honest  diligent sponsor Work the steps honestly with sponsor and Avoid temptations on the other.  She had some pause to that  thought.  She said that she had never thought of her life like that.  She recognized that she needed to make some big changes.  She is hoping to make it to [facility] on Thursday.  She is hopeful that she will be there longer than the standard 30 days.  She was informed that if she were working hard on her addiction, then sometimes they decide to keep people longer.  She is hopeful that she can show that hard work so she can get longer treatment.  That was she will be in a safe place when the TPR of her children takes place.  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  She reported that she was able to see how that could make a difference.  She reported that she had a lot of cravings for alcohol, so she was started on Campral for that.  She reports other cravings.  Will see how she does on the Campral.  She is having stomach cramping along with arm and leg cramps.  She has been started on Clonidine patch for that.  Anxiety seems incompletely treated with Throazine 50 mg, pt requests to push to 75 mg, will do.  Tegretol level Sun AM was therapeutic and the level Sun PM was high after she got Tegretol all day.  Protonix started for heartburn.  She is now on Celexa.  Too early to see any difference.  Don't think that current symptoms are hyperserotonergic syndrome.  Doubled Robaxin for her back and other cramps.  Jovannie Ulibarri 06/05/2011, 6:41 PM

## 2011-06-05 NOTE — Progress Notes (Signed)
D:  Pt attended 300 hall group meeting tonight.  A:  Encouraged continued attendance to groups and peer support.  R:  Pt is safe. Aundria Rud, Ziyad Dyar L, MHT/NS

## 2011-06-05 NOTE — Progress Notes (Signed)
Pt attended discharge planning group and actively participated.  Pt presents with flat affect and depressed mood.  Pt ranks depression and anxiety at a 10 today.  Pt reports SI but contracts for safety.  Pt states that she feels worse than when she came in on admission.  Pt states that she is feeling guilty about her kids and missing a scheduled phone call with them last week.  Pt states that her grandpa is getting open heart surgery right now.  Pt states that she is tired and feels "heavy" from the anxiety.  Pt will follow up at [facility] on Thursday for further treatment.  No further needs voiced by pt at this time.    Reyes Ivan, LCSWA 06/05/2011  10:30 AM

## 2011-06-05 NOTE — Tx Team (Signed)
Interdisciplinary Treatment Plan Update (Adult)  Date:  06/05/2011  Time Reviewed:  10:09 AM   Progress in Treatment: Attending groups: Yes Participating in groups:  Yes Taking medication as prescribed: Yes Tolerating medication:  Yes Family/Significant other contact made:  Yes, contact made with mother Patient understands diagnosis:  Yes Discussing patient identified problems/goals with staff:  Yes Medical problems stabilized or resolved:  Yes Denies suicidal/homicidal ideation: Yes Issues/concerns per patient self-inventory:  None identified Other: N/A  New problem(s) identified: None Identified  Reason for Continuation of Hospitalization: Anxiety Depression Medication stabilization Suicidal Ideation Withdrawal Symptoms  Interventions implemented related to continuation of hospitalization: mood stabilization, medication monitoring and adjustment, group therapy and psycho education, safety checks q 15 mins  Additional comments: N/A  Estimated length of stay: 3-4 days  Discharge Plan: Pt will follow up at [facility] for further SA treatment.    New goal(s): N/A  Review of initial/current patient goals per problem list:    1.  Goal(s): Reduce depressive symptoms  Met:  No  Target date: by discharge  As evidenced by: Reducing depression from a 10 to a 3 as reported by pt. Pt ranks at a 10 today.   2.  Goal (s): Reduce/Eliminate suicidal ideation  Met:  No  Target date: by discharge  As evidenced by: pt reporting no SI.    3.  Goal(s): Reduce anxiety symptoms  Met:  No  Target date: by discharge  As evidenced by: Reduce anxiety from a 10 to a 3 as reported by pt. Pt ranks at a 10 today.    Attendees: Patient:     Family:     Physician:  Orson Aloe, MD  06/05/2011  10:09 AM   Nursing:   Quintella Reichert, RN 06/05/2011 10:12 AM   Case Manager:  Reyes Ivan, LCSWA 06/05/2011  10:09 AM   Counselor:  Angus Palms, LCSW 06/05/2011  10:09 AM   Other:   Juline Patch, LCSW 06/05/2011  10:09 AM   Other:  Chinita Greenland, RN 06/05/2011  10:09 AM   Other:  Corinne Ports, Dr. student 06/05/2011 10:12 AM   Other:  Elissa Lovett, data manager  06/05/2011 10:13 AM    Scribe for Treatment Team:   Carmina Miller, 06/05/2011 , 10:09 AM

## 2011-06-05 NOTE — Progress Notes (Signed)
Patient ID: [name], female   DOB: [birth year], 31 y.o.   MRN: [id number] Pt is awake and active on the unit this PM. Pt is interacting with others in the milieu. Pt endorses passive SI but she is able to contract for safety. Pt states that her goal is to take her medications, listen to staff and to develop positive coping skills. Pt mood is depressed and her affect is flat but she is cooperative with staff. Writer will continue to monitor.

## 2011-06-06 MED ORDER — CHLORPROMAZINE HCL 25 MG PO TABS
25.0000 mg | ORAL_TABLET | Freq: Three times a day (TID) | ORAL | Status: DC | PRN
  Administered 2011-06-06 – 2011-06-07 (×2): 25 mg via ORAL

## 2011-06-06 MED ORDER — ZIPRASIDONE HCL 20 MG PO CAPS: 20.0000 mg | ORAL_CAPSULE | Freq: Two times a day (BID) | ORAL | Status: DC

## 2011-06-06 MED ORDER — ZIPRASIDONE HCL 40 MG PO CAPS
40.0000 mg | ORAL_CAPSULE | Freq: Two times a day (BID) | ORAL | Status: DC
  Administered 2011-06-06 – 2011-06-08 (×4): 40 mg via ORAL
  Filled 2011-06-06: qty 1
  Filled 2011-06-06: qty 14
  Filled 2011-06-06 (×2): qty 1
  Filled 2011-06-06: qty 14
  Filled 2011-06-06: qty 1

## 2011-06-06 NOTE — Progress Notes (Signed)
Pt attended discharge planning group and minimally participated.  Pt presents with sleepy affect and mood.  Pt ranks depression at a 10 and anxiety at a 8-9 today.  Pt reports on and off SI today but contracts for safety.  Pt states that voices have increased today.  Pt will go to [facility] on Thursday for further treatment.  No further needs voiced by pt at this time.    [name], LCSWA 06/06/2011  10:34 AM
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
## 2011-06-06 NOTE — Progress Notes (Signed)
Pt resting in bed with eyes closed.  No distress observed.  Safety maintained with q15 minute checks. 

## 2011-06-06 NOTE — Progress Notes (Signed)
Patient ID: [name], female   DOB: 06/01/1980, 31 y.o.   MRN: [id number] Pt reports sleep after taking medication.  She reports her energy level is low  And her depression is a 9/10 She is having some jerking movements rather tremors ans she is having back pain .  She says that her auditory hallucination have increased in frequency but decreased in intensity.  They are not telling her negative things..she contract for safety.

## 2011-06-06 NOTE — Progress Notes (Signed)
[facility] MD Progress Note  06/06/2011 1:08 PM  Diagnosis:  Axis I: Bipolar, Depressed, Generalized Anxiety Disorder and Substance Induced Mood Disorder  ADL's:  Intact  Sleep: Fair she slept well for the few hours that she got of sleep.  Appetite:  Good  Suicidal Ideation:  Pt has noted some suicidal thoughts, is able to contract for safety. Homicidal Ideation:  Denies adamantly any homicidal thoughts.  Mental Status Examination/Evaluation: Objective:  Appearance: Disheveled  Eye Contact::  Good  Speech:  Clear and Coherent  Volume:  Normal  Mood:  8 /10 on a scale of 1 is the best and 10 is the worst, mood is not good today and asks for something to help that. Anxiety: 8-9/10 on the same scale Thorazine works well for this, but she is having some voices and visions as well.  Will switch to Geodon for all the hallucinations and anxiety.  Affect:  Congruent  Thought Process:  Coherent  Orientation:  Full  Thought Content:  Hallucinations: Auditory Visual  Suicidal Thoughts:  Yes.  without intent/plan  Homicidal Thoughts:  No  Memory:  Immediate;   Good  Judgement:  Impaired  Insight:  Lacking  Psychomotor Activity:  Normal  Concentration:  Fair  Recall:  Fair  Akathisia:  No  Handed:  Right  AIMS (if indicated):     Assets:  Communication Skills Desire for Improvement  Sleep:  Number of Hours: 6.25    ROS: GI: better with the Protonix.  Little bit of cramping noted still.  MS: totally body aches are still pretty bad at 8-9/10  Neuro: some headaches noted, no dizziness noted.  CV: no palpitations, BP fine.  Vital Signs:Blood pressure 129/73, pulse 78, temperature 98 F (36.7 C), temperature source Oral, resp. rate 18, height 5\' 5"  (1.651 m), weight 111.812 kg (246 lb 8 oz), last menstrual period 05/30/2011, SpO2 96.00%. Current Medications: Current Facility-Administered Medications  Medication Dose Route Frequency Provider Last Rate Last Dose  . acamprosate (CAMPRAL)  tablet 666 mg  666 mg Oral TID WC Mike Craze, MD   666 mg at 06/06/11 1200  . acetaminophen (TYLENOL) tablet 650 mg  650 mg Oral Q6H PRN Verne Spurr, PA-C   650 mg at 06/02/11 1544  . carbamazepine (TEGRETOL) tablet 200 mg  200 mg Oral TID Mike Craze, MD   200 mg at 06/06/11 0817  . citalopram (CELEXA) tablet 10 mg  10 mg Oral Daily [name]. Adams, PA   10 mg at 06/06/11 1610  . cloNIDine (CATAPRES - Dosed in mg/24 hr) patch 0.2 mg  0.2 mg Transdermal Weekly Mike Craze, MD   0.2 mg at 06/02/11 1956  . cloNIDine (CATAPRES) tablet 0.1 mg  0.1 mg Oral Q6H PRN [name]. Adams, PA   0.1 mg at 06/06/11 0819  . dicyclomine (BENTYL) tablet 20 mg  20 mg Oral Q4H PRN Mike Craze, MD      . lidocaine (LIDODERM) 5 % 1 patch  1 patch Transdermal Q24H Mike Craze, MD   1 patch at 06/05/11 1949  . magnesium hydroxide (MILK OF MAGNESIA) suspension 30 mL  30 mL Oral Daily PRN Verne Spurr, PA-C      . methocarbamol (ROBAXIN) tablet 1,000 mg  1,000 mg Oral Q8H PRN Mike Craze, MD   1,000 mg at 06/06/11 1202  . mulitivitamin with minerals tablet 1 tablet  1 tablet Oral Daily Mike Craze, MD   1 tablet at 06/06/11 [phone]  .  naproxen (NAPROSYN) tablet 500 mg  500 mg Oral BID PRN Mike Craze, MD   500 mg at 06/06/11 1610  . nicotine (NICODERM CQ - dosed in mg/24 hours) patch 21 mg  21 mg Transdermal Daily Mike Craze, MD   21 mg at 06/06/11 0811  . pantoprazole (PROTONIX) EC tablet 20 mg  20 mg Oral BID AC Mike Craze, MD   20 mg at 06/06/11 0811  . thiamine (VITAMIN B-1) tablet 100 mg  100 mg Oral Daily Mike Craze, MD   100 mg at 06/06/11 0810  . traZODone (DESYREL) tablet 150 mg  150 mg Oral QHS Mike Craze, MD   150 mg at 06/05/11 2137  . traZODone (DESYREL) tablet 50 mg  50 mg Oral QHS PRN Mike Craze, MD      . ziprasidone (GEODON) capsule 20 mg  20 mg Oral BID WC Mike Craze, MD      . DISCONTD: chlorproMAZINE (THORAZINE) tablet 50 mg  50 mg Oral TID Mike Craze,  MD   50 mg at 06/05/11 1512  . DISCONTD: chlorproMAZINE (THORAZINE) tablet 75 mg  75 mg Oral TID Mike Craze, MD   75 mg at 06/06/11 0810  . DISCONTD: methocarbamol (ROBAXIN) tablet 500 mg  500 mg Oral Q8H PRN Mike Craze, MD   500 mg at 06/05/11 0920  . DISCONTD: traZODone (DESYREL) tablet 100 mg  100 mg Oral QHS Ronny Bacon, MD   100 mg at 06/04/11 2108    Lab Results:  Results for orders placed during the hospital encounter of 05/30/11 (from the past 48 hour(s))  URINALYSIS, ROUTINE W REFLEX MICROSCOPIC     Status: Abnormal   Collection Time   06/04/11  8:00 PM      Component Value Range Comment   Color, Urine YELLOW  YELLOW     APPearance CLOUDY (*) CLEAR     Specific Gravity, Urine 1.020  1.005 - 1.030     pH 6.0  5.0 - 8.0     Glucose, UA >1000 (*) NEGATIVE (mg/dL)    Hgb urine dipstick NEGATIVE  NEGATIVE     Bilirubin Urine NEGATIVE  NEGATIVE     Ketones, ur NEGATIVE  NEGATIVE (mg/dL)    Protein, ur NEGATIVE  NEGATIVE (mg/dL)    Urobilinogen, UA 0.2  0.0 - 1.0 (mg/dL)    Nitrite NEGATIVE  NEGATIVE     Leukocytes, UA SMALL (*) NEGATIVE    URINE MICROSCOPIC-ADD ON     Status: Abnormal   Collection Time   06/04/11  8:00 PM      Component Value Range Comment   Squamous Epithelial / LPF MANY (*) RARE     WBC, UA 3-6  <3 (WBC/hpf)    RBC / HPF 0-2  <3 (RBC/hpf)    Bacteria, UA FEW (*) RARE      Physical Findings: AIMS:  , ,  ,  ,    CIWA:  CIWA-Ar Total: 4  COWS:  COWS Total Score: 5   Treatment Plan Summary: Daily contact with patient to assess and evaluate symptoms and progress in treatment Medication management  Discussion/Plan: Campral is helping her cravings.  She is feeling that she can deal with the little bit of cravings that she is having.  Anxiety is completely treated with Throazine 75 mg, but her voices are not responding to that, will switch to Geodon and add back Throazine if her anxiety does not respond  to the Geodon.   She is now on Celexa.  She  notes no difficulties on this.  Not much response yet either.  Doubling the Robaxin did help her back and other cramps.  Jahron Hunsinger 06/06/2011, 1:08 PM

## 2011-06-06 NOTE — Progress Notes (Signed)
Grief and loss group  Facilitated grief and loss group on [facility] [address]. Discussed types of losses one may encounter (re: death/dying, job loss, relationships, self). Group largely focused on loss of sense of self and grief reactions to that (re: anger, depression/sadness, loneliness). Facilitated short mindfulness activity to assist pt's in sitting w/ emotions related to grief and being able to experience them non-judgmentally before letting them go. Group was initially engaged and became more quiet as time progressed. Group was supportive of one another and provided feedback.  Pt was active and engaged in the group. Pt shared that she had left her husband and DSS had taken her children. Pt recognized that she was experiencing multiple losses (re: close family relationships, identity/meaning as mother) that are feeding into her grief cycle. Pt reported that she "is my own worst critic" as evidenced by feeling guilty and blaming self especially when happy. Pt received support and insight from the group and engaged in mindfulness activity.  Onesti Bonfiglio B MS, LPCA, NCC

## 2011-06-07 DIAGNOSIS — F1920 Other psychoactive substance dependence, uncomplicated: Secondary | ICD-10-CM

## 2011-06-07 LAB — CARBAMAZEPINE, FREE AND TOTAL
Carbamazepine Metabolite -: 2 ug/mL (ref 0.2–2.0)
Carbamazepine, Bound: 8.8 ug/mL
Carbamazepine, Free: 2.9 ug/mL (ref 1.0–3.0)

## 2011-06-07 MED ORDER — CHLORPROMAZINE HCL 50 MG PO TABS
75.0000 mg | ORAL_TABLET | Freq: Three times a day (TID) | ORAL | Status: DC
  Administered 2011-06-07 – 2011-06-08 (×3): 75 mg via ORAL
  Filled 2011-06-07: qty 3
  Filled 2011-06-07 (×2): qty 32
  Filled 2011-06-07 (×2): qty 3
  Filled 2011-06-07: qty 32

## 2011-06-07 MED ORDER — CITALOPRAM HYDROBROMIDE 10 MG PO TABS: 10.0000 mg | ORAL_TABLET | Freq: Every day | ORAL | Status: DC

## 2011-06-07 MED ORDER — CYCLOBENZAPRINE HCL 10 MG PO TABS: 10.0000 mg | ORAL_TABLET | Freq: Three times a day (TID) | ORAL | Status: DC | PRN

## 2011-06-07 MED ORDER — PANTOPRAZOLE SODIUM 20 MG PO TBEC: 20.0000 mg | DELAYED_RELEASE_TABLET | Freq: Two times a day (BID) | ORAL | Status: DC

## 2011-06-07 MED ORDER — FLUOXETINE HCL 20 MG PO CAPS: 60.0000 mg | ORAL_CAPSULE | Freq: Every day | ORAL | Status: DC

## 2011-06-07 MED ORDER — ZIPRASIDONE HCL 40 MG PO CAPS: 40.0000 mg | ORAL_CAPSULE | Freq: Two times a day (BID) | ORAL | Status: DC

## 2011-06-07 MED ORDER — TRAZODONE HCL 150 MG PO TABS: 150.0000 mg | ORAL_TABLET | Freq: Every day | ORAL | Status: DC

## 2011-06-07 MED ORDER — CLONIDINE HCL 0.1 MG PO TABS: 0.1000 mg | ORAL_TABLET | Freq: Four times a day (QID) | ORAL | Status: DC | PRN

## 2011-06-07 MED ORDER — ACAMPROSATE CALCIUM 333 MG PO TBEC: 666.0000 mg | DELAYED_RELEASE_TABLET | Freq: Three times a day (TID) | ORAL | Status: AC

## 2011-06-07 MED ORDER — LIDOCAINE 5 % EX PTCH: 1.0000 | MEDICATED_PATCH | CUTANEOUS | Status: AC

## 2011-06-07 MED ORDER — CARBAMAZEPINE 200 MG PO TABS: 200.0000 mg | ORAL_TABLET | Freq: Three times a day (TID) | ORAL | Status: DC

## 2011-06-07 MED ORDER — CHLORPROMAZINE HCL 25 MG PO TABS: 75.0000 mg | ORAL_TABLET | Freq: Three times a day (TID) | ORAL | Status: DC

## 2011-06-07 MED ORDER — CLONIDINE HCL 0.1 MG PO TABS
0.1000 mg | ORAL_TABLET | Freq: Four times a day (QID) | ORAL | Status: DC | PRN
  Administered 2011-06-07: 0.1 mg via ORAL
  Filled 2011-06-07: qty 1

## 2011-06-07 MED ORDER — NICOTINE 21 MG/24HR TD PT24: 1.0000 | MEDICATED_PATCH | Freq: Every day | TRANSDERMAL | Status: AC

## 2011-06-07 MED ORDER — ALUM & MAG HYDROXIDE-SIMETH 200-200-20 MG/5ML PO SUSP
30.0000 mL | Freq: Four times a day (QID) | ORAL | Status: DC | PRN
  Administered 2011-06-07: 30 mL via ORAL

## 2011-06-07 MED ORDER — METHOCARBAMOL 500 MG PO TABS: 1000.0000 mg | ORAL_TABLET | Freq: Three times a day (TID) | ORAL | Status: AC

## 2011-06-07 MED ORDER — METHOCARBAMOL 500 MG PO TABS
1000.0000 mg | ORAL_TABLET | Freq: Three times a day (TID) | ORAL | Status: DC
  Administered 2011-06-07 – 2011-06-08 (×3): 1000 mg via ORAL
  Filled 2011-06-07: qty 42
  Filled 2011-06-07 (×2): qty 2
  Filled 2011-06-07 (×2): qty 42
  Filled 2011-06-07: qty 2

## 2011-06-07 NOTE — Progress Notes (Signed)
[facility] Case Management Discharge Plan:  Will you be returning to the same living situation after discharge: Yes,  return home after tx At discharge, do you have transportation home?:Yes,  provided bus pass Do you have the ability to pay for your medications:Yes,  access to meds, provided samples  Release of information consent forms completed and in the chart;  Patient's signature needed at discharge.  Patient to Follow up at:  Follow-up Information    Follow up with [facility]  on 06/08/2011. (Arrive there at 8:00 am promptly!!)    Contact information:   [address] [place], [place] [address] [phone]      Follow up with [facility]. (Follow up after [facility])    Contact information:   [address] [place], [place] [address] [phone] Office [phone] Fax         Patient denies SI/HI:   Yes,  denies SI/HI    Safety Planning and Suicide Prevention discussed:  Yes,  discussed with pt  Barrier to discharge identified:No.  Summary and Recommendations: No recommendations from SW.  No further needs voiced by pt.  Pt stable to discharge.     Carmina Miller 06/07/2011, 10:36 AM

## 2011-06-07 NOTE — Discharge Planning (Addendum)
Pt attended group on today. Pt appeared to be in a calm mood. Pt rated her depression as a 7 and anxiety as a 8. Pt reported that she is still fighting with herself and her mood is up and down. Pt verbalized that she feels as though she is ready for discharge tomorrow. Pt denies SI/HI at this time. Pt verbalized that she understands that she will be going to [facility] and doesn't have any concerns at this time.  Safety planning and suicide prevention discussed.

## 2011-06-07 NOTE — Tx Team (Signed)
Interdisciplinary Treatment Plan Update (Adult)  Date:  06/07/2011  Time Reviewed:  10:57 AM   Progress in Treatment: Attending groups: Yes Participating in groups:  Yes Taking medication as prescribed: Yes Tolerating medication:  Yes Family/Significant other contact made:  Yes Patient understands diagnosis:  Yes Discussing patient identified problems/goals with staff:  Yes Medical problems stabilized or resolved:  Yes Denies suicidal/homicidal ideation: Yes Issues/concerns per patient self-inventory:  None identified Other: N/A  New problem(s) identified: None Identified  Reason for Continuation of Hospitalization: Stable to d/c tomorrow  Interventions implemented related to continuation of hospitalization: Stable to d/c tomorrow  Additional comments: N/A  Estimated length of stay: 1 day - D/C tomorrow  Discharge Plan: Pt will follow up at [facility] tomorrow for further SA treatment  New goal(s): N/A  Review of initial/current patient goals per problem list:    1.  Goal(s): Reduce depressive symptoms  Met:  No  Target date: by discharge  As evidenced by: Reducing depression from a 10 to a 3 as reported by pt. Pt ranks at a 7 today but reports feeling stable to d/c tomorrow.   2.  Goal (s): Reduce/Eliminate suicidal ideation  Met:  Yes  Target date: by discharge  As evidenced by: pt denies SI.   3.  Goal(s): Reduce anxiety symptoms  Met:  No  Target date: by discharge  As evidenced by: Reduce anxiety from a 10 to a 3 as reported by pt. Pt ranks at an 8 today but reports feeling stable to d/c tomorrow.    Attendees: Patient:  [name] 06/07/2011 10:59 AM   Family:     Physician:  Orson Aloe, MD  06/07/2011  10:57 AM   Nursing:   Cato Mulligan, RN 06/07/2011 10:59 AM   Case Manager:  Reyes Ivan, LCSWA 06/07/2011  10:57 AM   Counselor:  Angus Palms, LCSW 06/07/2011  10:57 AM   Other:  Juline Patch, LCSW 06/07/2011  10:57 AM   Other:   Osborn Coho, LCSWA 06/07/2011  10:57 AM   Other:     Other:      Scribe for Treatment Team:   Carmina Miller, 06/07/2011 , 10:57 AM
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
## 2011-06-07 NOTE — Progress Notes (Signed)
Pt is asleep at this time.Pt appears to be in no signs of distress at this time. Pt remains safe with q15min checks.    

## 2011-06-07 NOTE — Progress Notes (Signed)
06/07/2011         Time: 1415      Group Topic/Focus: The focus of this group is on enhancing the patient's understanding of leisure, barriers to leisure, and the importance of engaging in positive leisure activities upon discharge for improved total health.  Participation Level: Active  Participation Quality: Attentive  Affect: Appropriate  Cognitive: Oriented   Additional Comments: None.   Carin Shipp 06/07/2011 2:50 PM 

## 2011-06-07 NOTE — Progress Notes (Signed)
Pt approached this Clinical research associate with c/o body aches due to w/d from "etoh,heroin,and THC." PRN medication requested and received for anxiety and body aches and instructed to inform provider.  Rates depression and hopelessness at 7. With poor sleep but good appetite.  "Off and on" SI and contracts x2 for safety.  Support offered. Cont current POC and 15' checks for safety.

## 2011-06-07 NOTE — Progress Notes (Signed)
[facility] Group Notes:  (Counselor/Nursing/MHT/Case Management/Adjunct) 06/07/2011  1:15pm Emotion Regulation   Type of Therapy:  Group Therapy  Participation Level:  Did Not Attend     [name] 06/07/2011  2:59 PM
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
## 2011-06-08 NOTE — Progress Notes (Signed)
Discharge Note:   Patient has bus ticket, will go to [facility] and Floydene Flock will pick up pt and take her to [facility] facility.   Denied SI and HI.   Denied A/V hallucinations.   Denied pain.   Suicide prevention information was reviewed and given to patient, who stated she understood and had no questions.   Patient received all her belongings, prescriptions, meds, clothing, misc items, wallet, id, cigs, pants strings, keys, etc.  Patient stated she appreciated all the staff has done to assist her while at [facility]. Patient has been cooperative, pleasant, alert.

## 2011-06-08 NOTE — Progress Notes (Signed)
Pt is observed resting in bed with eyes closed. RR WNL, even and unlabored. Color is WDL. Level III obs in place. Pt is safe. Lawrence Marseilles

## 2011-06-11 NOTE — Discharge Summary (Signed)
I agree with this D/C Summary.  

## 2011-06-13 NOTE — Progress Notes (Signed)
Patient Discharge Instructions:  Psychiatric Admission Assessment Note Provided,  06/12/2011 After Visit Summary (AVS) Provided,  06/12/2011 Face Sheet Provided, 06/12/2011 Faxed/Sent to the [facility] provider:  06/12/2011 Provided Suicide Risk Assessment - Discharge Assessment 06/12/2011  Faxed to [facility] @ [phone] And to [facility] @ [phone]  Wandra Scot, 06/13/2011, 1:37 PM

## 2011-06-26 ENCOUNTER — Encounter (HOSPITAL_BASED_OUTPATIENT_CLINIC_OR_DEPARTMENT_OTHER): Payer: Self-pay | Admitting: *Deleted

## 2011-06-26 ENCOUNTER — Emergency Department (HOSPITAL_BASED_OUTPATIENT_CLINIC_OR_DEPARTMENT_OTHER)
Admission: EM | Admit: 2011-06-26 | Discharge: 2011-06-26 | Disposition: A | Discharge status: Home / Self Care | Payer: Medicaid Other | Attending: Emergency Medicine | Admitting: Emergency Medicine

## 2011-06-26 DIAGNOSIS — F17200 Nicotine dependence, unspecified, uncomplicated: Secondary | ICD-10-CM | POA: Insufficient documentation

## 2011-06-26 DIAGNOSIS — F19939 Other psychoactive substance use, unspecified with withdrawal, unspecified: Secondary | ICD-10-CM | POA: Insufficient documentation

## 2011-06-26 DIAGNOSIS — J45909 Unspecified asthma, uncomplicated: Secondary | ICD-10-CM | POA: Insufficient documentation

## 2011-06-26 DIAGNOSIS — F329 Major depressive disorder, single episode, unspecified: Secondary | ICD-10-CM | POA: Insufficient documentation

## 2011-06-26 DIAGNOSIS — F3289 Other specified depressive episodes: Secondary | ICD-10-CM | POA: Insufficient documentation

## 2011-06-26 DIAGNOSIS — F411 Generalized anxiety disorder: Secondary | ICD-10-CM | POA: Insufficient documentation

## 2011-06-26 DIAGNOSIS — F1920 Other psychoactive substance dependence, uncomplicated: Secondary | ICD-10-CM | POA: Insufficient documentation

## 2011-06-26 MED ORDER — CHLORPROMAZINE HCL 25 MG PO TABS: 75.0000 mg | ORAL_TABLET | Freq: Three times a day (TID) | ORAL | Status: DC

## 2011-06-26 MED ORDER — CHLORPROMAZINE HCL 25 MG PO TABS
ORAL_TABLET | ORAL | Status: AC
  Administered 2011-06-26: 75 mg via ORAL
  Filled 2011-06-26: qty 3

## 2011-06-26 MED ORDER — PANTOPRAZOLE SODIUM 20 MG PO TBEC: 20.0000 mg | DELAYED_RELEASE_TABLET | Freq: Two times a day (BID) | ORAL | Status: DC

## 2011-06-26 MED ORDER — CITALOPRAM HYDROBROMIDE 10 MG PO TABS: 10.0000 mg | ORAL_TABLET | Freq: Every day | ORAL | Status: DC

## 2011-06-26 MED ORDER — CHLORPROMAZINE HCL 25 MG PO TABS
75.0000 mg | ORAL_TABLET | Freq: Once | ORAL | Status: AC
  Administered 2011-06-26: 75 mg via ORAL

## 2011-06-26 MED ORDER — LIDOCAINE 5 % EX PTCH: 1.0000 | MEDICATED_PATCH | CUTANEOUS | Status: AC

## 2011-06-26 MED ORDER — ZIPRASIDONE HCL 40 MG PO CAPS: 40.0000 mg | ORAL_CAPSULE | Freq: Two times a day (BID) | ORAL | Status: DC

## 2011-06-26 MED ORDER — CARBAMAZEPINE 200 MG PO TABS: 200.0000 mg | ORAL_TABLET | Freq: Three times a day (TID) | ORAL | Status: DC

## 2011-06-26 MED ORDER — TRAZODONE HCL 150 MG PO TABS: 150.0000 mg | ORAL_TABLET | Freq: Every day | ORAL | Status: DC

## 2011-06-26 NOTE — ED Notes (Signed)
States she needs a Rx for Thorazine. She has been a patient at [facility] drug rehab facility since 06-08-2011. She feels shaky, itching, hearing and seeing things. Feels anxious and is having trouble concentrating.

## 2011-06-26 NOTE — ED Provider Notes (Addendum)
History     CSN: [id number]  Arrival date & time 06/26/11  1304   First MD Initiated Contact with Patient 06/26/11 1327      No chief complaint on file.   (Consider location/radiation/quality/duration/timing/severity/associated sxs/prior treatment) Patient is a 31 y.o. female presenting with mental health disorder. The history is provided by the patient.  Mental Health Problem The primary symptoms include hallucinations and bizarre behavior. Primary symptoms comment: Patient has been out of her Thorazine for the last day and a half. Since that time she's had symptoms of anxiety, hallucinations, unprovoked fear The current episode started yesterday. This is a new problem.  Onset: yesterday worsening today. The hallucinations appear to have been worsening since their onset. She has auditory and visual hallucinations.  The degree of incapacity that she is experiencing as a consequence of her illness is moderate. Additional symptoms of the illness include agitation, flight of ideas, distractible and poor judgment. Additional symptoms of the illness do not include no seizures. Risk factors that are present for mental illness include a history of mental illness and substance abuse.    Past Medical History  Diagnosis Date  . Depression   . Asthma   . Pneumonia   . Anemia   . Headache   . Anxiety     Past Surgical History  Procedure Date  . Cesarean section 2006  . Thyroid surgery 2003    No family history on file.  History  Substance Use Topics  . Smoking status: Current Everyday Smoker -- 0.5 packs/day  . Smokeless tobacco: Never Used  . Alcohol Use: 10.2 oz/week    12 Cans of beer, 2 Glasses of wine, 3 Shots of liquor per week    OB History    Grav Para Term Preterm Abortions TAB SAB Ect Mult Living                  Review of Systems  Neurological: Negative for seizures.  Psychiatric/Behavioral: Positive for hallucinations and agitation.  All other systems reviewed  and are negative.    Allergies  Review of patient's allergies indicates no known allergies.  Home Medications   Current Outpatient Rx  Name Route Sig Dispense Refill  . CARBAMAZEPINE 200 MG PO TABS Oral Take 1 tablet (200 mg total) by mouth 3 (three) times daily. For anxiety and mood management 90 tablet 0  . CHLORPROMAZINE HCL 25 MG PO TABS Oral Take 3 tablets (75 mg total) by mouth 3 (three) times daily. For anxiety 90 tablet 0  . CITALOPRAM HYDROBROMIDE 10 MG PO TABS Oral Take 1 tablet (10 mg total) by mouth daily. For depression. 30 tablet 0  . CLONIDINE HCL 0.1 MG PO TABS Oral Take 1 tablet (0.1 mg total) by mouth every 6 (six) hours as needed (to help with withdrawl even on the patch). For withdrawal 60 tablet 0  . LIDOCAINE 5 % EX PTCH Transdermal Place 1 patch onto the skin daily. Remove & Discard patch within 12 hours or as directed by MD  For management of pain 30 patch 0  . NICOTINE 21 MG/24HR TD PT24 Transdermal Place 1 patch onto the skin daily. Use 1 patch daily 28 mg daily for 3 weeks, then 14 mg for 2 weeks, then 7 mg for 2 weeks 28 patch 0  . PANTOPRAZOLE SODIUM 20 MG PO TBEC Oral Take 1 tablet (20 mg total) by mouth 2 (two) times daily before a meal. For control of stomach acid secretion and helps GERD.  60 tablet 0  . TRAZODONE HCL 150 MG PO TABS Oral Take 1 tablet (150 mg total) by mouth at bedtime. For insomnia. 30 tablet 0  . ZIPRASIDONE HCL 40 MG PO CAPS Oral Take 1 capsule (40 mg total) by mouth 2 (two) times daily with a meal. For mood control. 60 capsule 0    BP 157/90  Pulse 94  Temp(Src) 98.3 F (36.8 C) (Oral)  Resp 20  SpO2 99%  LMP 05/30/2011  Physical Exam  Nursing note and vitals reviewed. Constitutional: She is oriented to person, place, and time. She appears well-developed and well-nourished. She appears distressed.  HENT:  Head: Normocephalic and atraumatic.  Eyes: EOM are normal. Pupils are equal, round, and reactive to light.  Cardiovascular:  Normal rate, regular rhythm, normal heart sounds and intact distal pulses.  Exam reveals no friction rub.   No murmur heard. Pulmonary/Chest: Effort normal and breath sounds normal. She has no wheezes. She has no rales.  Abdominal: Soft. Bowel sounds are normal. She exhibits no distension. There is no tenderness. There is no rebound and no guarding.  Musculoskeletal: Normal range of motion. She exhibits no tenderness.       No edema  Neurological: She is alert and oriented to person, place, and time. No cranial nerve deficit.  Skin: Skin is warm and dry. No rash noted.  Psychiatric: Her mood appears anxious. She is actively hallucinating.    ED Course  Procedures (including critical care time)  Labs Reviewed - No data to display No results found.   1. Medication withdrawal       MDM   Patient here to to running out of her Thorazine. She is having withdrawal symptoms including anxiety, shakiness, visual and auditory hallucinations that are minor and states she feels scared but she doesn't know of what. Her last dose of Thorazine was yesterday morning. The symptoms started at she missed her dose in the afternoon. She typically takes 75 mg 3 times a day. She was not psychotic on exam and appears anxious but otherwise has a normal exam. Patient given 75 mg of oral Thorazine. Will ensure that her symptoms start to improve before discharging her back to day [facility].  2:19 PM Patient apparently much better. Refill her medication prescriptions and discharge her back to [facility].      Gwyneth Sprout, MD 06/26/11 1419  Gwyneth Sprout, MD 06/26/11 1429

## 2011-06-26 NOTE — Discharge Instructions (Signed)
Take your current medication as prescribed. Followup with her psychiatrist as needed. Continue your current outpatient detox.

## 2016-05-22 ENCOUNTER — Emergency Department (HOSPITAL_COMMUNITY)
Admission: EM | Admit: 2016-05-22 | Discharge: 2016-05-23 | Disposition: A | Discharge status: Other Acute Inpatient Hospital | Payer: Self-pay | Attending: Emergency Medicine | Admitting: Emergency Medicine

## 2016-05-22 ENCOUNTER — Encounter (HOSPITAL_COMMUNITY): Payer: Self-pay | Admitting: Emergency Medicine

## 2016-05-22 DIAGNOSIS — F319 Bipolar disorder, unspecified: Secondary | ICD-10-CM | POA: Insufficient documentation

## 2016-05-22 DIAGNOSIS — F17200 Nicotine dependence, unspecified, uncomplicated: Secondary | ICD-10-CM | POA: Insufficient documentation

## 2016-05-22 DIAGNOSIS — F250 Schizoaffective disorder, bipolar type: Secondary | ICD-10-CM | POA: Insufficient documentation

## 2016-05-22 DIAGNOSIS — Z79899 Other long term (current) drug therapy: Secondary | ICD-10-CM | POA: Insufficient documentation

## 2016-05-22 DIAGNOSIS — J45909 Unspecified asthma, uncomplicated: Secondary | ICD-10-CM | POA: Insufficient documentation

## 2016-05-22 LAB — COMPREHENSIVE METABOLIC PANEL
ALT: 19 U/L (ref 14–54)
AST: 19 U/L (ref 15–41)
Albumin: 4.3 g/dL (ref 3.5–5.0)
Alkaline Phosphatase: 67 U/L (ref 38–126)
Anion gap: 7 (ref 5–15)
BUN: 11 mg/dL (ref 6–20)
CALCIUM: 9.3 mg/dL (ref 8.9–10.3)
CO2: 24 mmol/L (ref 22–32)
CREATININE: 0.92 mg/dL (ref 0.44–1.00)
Chloride: 105 mmol/L (ref 101–111)
GFR calc Af Amer: >60 mL/min (ref >60)
Glucose, Bld: 88 mg/dL (ref 65–99)
POTASSIUM: 4.2 mmol/L (ref 3.5–5.1)
Sodium: 136 mmol/L (ref 135–145)
TOTAL PROTEIN: 7.7 g/dL (ref 6.5–8.1)
Total Bilirubin: 0.3 mg/dL (ref 0.3–1.2)

## 2016-05-22 LAB — CBC
HCT: 43.6 % (ref 36.0–46.0)
Hemoglobin: 14.9 g/dL (ref 12.0–15.0)
MCH: 32.1 pg (ref 26.0–34.0)
MCHC: 34.2 g/dL (ref 30.0–36.0)
MCV: 94 fL (ref 78.0–100.0)
PLATELETS: 335 10*3/uL (ref 150–400)
RBC: 4.64 MIL/uL (ref 3.87–5.11)
RDW: 13.7 % (ref 11.5–15.5)
WBC: 15.7 10*3/uL — ABNORMAL HIGH (ref 4.0–10.5)

## 2016-05-22 LAB — ETHANOL

## 2016-05-22 LAB — RAPID URINE DRUG SCREEN, HOSP PERFORMED
Amphetamines: NOT DETECTED
Barbiturates: NOT DETECTED
Benzodiazepines: NOT DETECTED
Cocaine: NOT DETECTED
Opiates: NOT DETECTED
Tetrahydrocannabinol: POSITIVE — AB

## 2016-05-22 LAB — SALICYLATE LEVEL: Salicylate Lvl: <7 mg/dL (ref 2.8–30.0)

## 2016-05-22 LAB — POC URINE PREG, ED: PREG TEST UR: NEGATIVE

## 2016-05-22 LAB — ACETAMINOPHEN LEVEL: Acetaminophen (Tylenol), Serum: 14 ug/mL (ref 10–30)

## 2016-05-22 NOTE — ED Notes (Signed)
Bed: [facility] Expected date:  Expected time:  Means of arrival:  Comments: 

## 2016-05-22 NOTE — ED Notes (Signed)
Pt states she has never taken her piercing's out and does not know how

## 2016-05-22 NOTE — ED Provider Notes (Signed)
[facility]-EMERGENCY DEPT Provider Note   CSN: [id number] Arrival date & time: 05/22/16  2233  By signing my name below, I, Rosario Adie, attest that this documentation has been prepared under the direction and in the presence of [employer], PA-C.  Electronically Signed: Rosario Adie, ED Scribe. 05/22/16. 11:32 PM.   History   Chief Complaint Chief Complaint  Patient presents with  . Suicidal    The history is provided by the patient. No language interpreter was used.    HPI Comments: [name] is a 36 y.o. female BIB [employer], with a PMHx of anxiety, depression, and schizoaffective disorder, who presents to the Emergency Department complaining of gradually worsening suicidal ideation beginning several weeks ago. She states that she has a current plan of lacerating her own wrist. Pt reports that she currently does not feel safe in her home and d/t her currently living situation and issue with her friends, significant other, and mother that this has worsened her suicidality. Pt also reports that she has been moving between several different living situations over the past several months as well. She has previously been medicated on psychotherapy medications, but is not currently undergoing treatment. Pt is not currently followed by a psychotherapist, but has previously. Pt does have a h/o self-injury, but non recently. She does occasionally drink and smoke marijuana; denies any other illicit drug usage. She denies HI, auditory/visual hallucinations, or any other associated symptoms.    Past Medical History:  Diagnosis Date  . Anemia   . Anxiety   . Asthma   . Depression   . Headache(784.0)   . Pneumonia    Patient Active Problem List   Diagnosis Date Noted  . Schizoaffective disorder, bipolar type (HCC) 05/31/2011  . Psychoactive substance-induced organic mood disorder (HCC) 05/31/2011    Class: Chronic   Past Surgical History:  Procedure Laterality Date  . CESAREAN  SECTION  2006  . THYROID SURGERY  2003   OB History    No data available     Home Medications    Prior to Admission medications   Medication Sig Start Date End Date Taking? Authorizing Provider  carbamazepine (TEGRETOL) 200 MG tablet Take 1 tablet (200 mg total) by mouth 3 (three) times daily. For anxiety and mood management 06/07/11 06/06/12  Mike Craze, MD  carbamazepine (TEGRETOL) 200 MG tablet Take 1 tablet (200 mg total) by mouth 3 (three) times daily. 06/26/11 06/25/12  Gwyneth Sprout, MD  chlorproMAZINE (THORAZINE) 25 MG tablet Take 3 tablets (75 mg total) by mouth 3 (three) times daily. For anxiety 06/07/11 07/07/11  Mike Craze, MD  chlorproMAZINE (THORAZINE) 25 MG tablet Take 3 tablets (75 mg total) by mouth 3 (three) times daily. 06/26/11 07/26/11  Gwyneth Sprout, MD  citalopram (CELEXA) 10 MG tablet Take 1 tablet (10 mg total) by mouth daily. For depression. 06/07/11 06/06/12  Mike Craze, MD  citalopram (CELEXA) 10 MG tablet Take 1 tablet (10 mg total) by mouth daily. 06/26/11 06/25/12  Gwyneth Sprout, MD  cloNIDine (CATAPRES) 0.1 MG tablet Take 1 tablet (0.1 mg total) by mouth every 6 (six) hours as needed (to help with withdrawl even on the patch). For withdrawal 06/07/11 06/06/12  Mike Craze, MD  pantoprazole (PROTONIX) 20 MG tablet Take 1 tablet (20 mg total) by mouth 2 (two) times daily before a meal. For control of stomach acid secretion and helps GERD. 06/07/11 06/06/12  Mike Craze, MD  pantoprazole (PROTONIX) 20 MG tablet Take  1 tablet (20 mg total) by mouth 2 (two) times daily. 06/26/11 06/25/12  Gwyneth Sprout, MD  ziprasidone (GEODON) 40 MG capsule Take 1 capsule (40 mg total) by mouth 2 (two) times daily with a meal. For mood control. 06/07/11 09/05/11  Mike Craze, MD  ziprasidone (GEODON) 40 MG capsule Take 1 capsule (40 mg total) by mouth 2 (two) times daily with a meal. 06/26/11 09/24/11  Gwyneth Sprout, MD   Family History No family history on  file.  Social History Social History  Substance Use Topics  . Smoking status: Current Every Day Smoker    Packs/day: 0.50  . Smokeless tobacco: Never Used  . Alcohol use 10.2 oz/week    12 Cans of beer, 2 Glasses of wine, 3 Shots of liquor per week   Allergies   Patient has no known allergies.  Review of Systems Review of Systems  Constitutional: Negative for fever.  Psychiatric/Behavioral: Positive for suicidal ideas. Negative for hallucinations.   A complete 10 system review of systems was obtained and all systems are negative except as noted in the HPI and PMH.    Physical Exam Updated Vital Signs BP 126/90 (BP Location: Left Arm)   Pulse 86   Temp 98.3 F (36.8 C) (Oral)   Resp 18   Ht 5\' 4"  (1.626 m)   LMP 04/12/2016   SpO2 97%   Physical Exam  Constitutional: She is oriented to person, place, and time. She appears well-developed and well-nourished. No distress.  Nontoxic appearing and in no acute distress.  HENT:  Head: Normocephalic and atraumatic.  Eyes: Conjunctivae and EOM are normal. No scleral icterus.  Neck: Normal range of motion.  Cardiovascular: Normal rate, regular rhythm and intact distal pulses.   Pulmonary/Chest: Effort normal. No respiratory distress. She has no wheezes.  Respirations even and unlabored  Musculoskeletal: Normal range of motion.  Neurological: She is alert and oriented to person, place, and time. She exhibits normal muscle tone. Coordination normal.  GCS 15. Patient moving all extremities. Speech is goal oriented.  Skin: Skin is warm and dry. No rash noted. She is not diaphoretic. No erythema. No pallor.  Psychiatric: Her speech is normal and behavior is normal. Her mood appears anxious. She exhibits a depressed mood. She expresses suicidal ideation. She expresses no homicidal ideation. She expresses no suicidal plans and no homicidal plans.  Nursing note and vitals reviewed.   ED Treatments / Results  DIAGNOSTIC STUDIES: Oxygen  Saturation is 97% on RA, normal by my interpretation.   COORDINATION OF CARE: 11:32 PM-Discussed next steps with pt. Pt verbalized understanding and is agreeable with the plan.   Labs (all labs ordered are listed, but only abnormal results are displayed) Labs Reviewed  CBC - Abnormal; Notable for the following:       Result Value   WBC 15.7 (*)    All other components within normal limits  RAPID URINE DRUG SCREEN, HOSP PERFORMED - Abnormal; Notable for the following:    Tetrahydrocannabinol POSITIVE (*)    All other components within normal limits  COMPREHENSIVE METABOLIC PANEL  ETHANOL  SALICYLATE LEVEL  ACETAMINOPHEN LEVEL  POC URINE PREG, ED   EKG  EKG Interpretation None      Radiology No results found.  Procedures Procedures   Medications Ordered in ED Medications - No data to display  Initial Impression / Assessment and Plan / ED Course  I have reviewed the triage vital signs and the nursing notes.  Pertinent labs &  imaging results that were available during my care of the patient were reviewed by me and considered in my medical decision making (see chart for details).     36 year old female presents to the emergency department for worsening depression with suicidal ideations. She denies plan, though does report thoughts of cutting. She has a history of self-injurious behavior. The patient has been medically cleared and evaluated by TTS. TTS recommend inpatient psychiatric care. Placement currently pending. Disposition to be determined by oncoming ED provider.   Final Clinical Impressions(s) / ED Diagnoses   Final diagnoses:  Bipolar affective disorder, current episode depressed, current episode severity unspecified (HCC)    New Prescriptions New Prescriptions   No medications on file    I personally performed the services described in this documentation, which was scribed in my presence. The recorded information has been reviewed and is accurate.        Antony MaduraKelly Natacha Jepsen, PA-C 05/23/16 0404    Cy BlamerApril Palumbo, MD 05/23/16 [phone]

## 2016-05-22 NOTE — ED Triage Notes (Signed)
Pt brought in voluntarily by [employer] reports having suicidal ideations. Pt states her plan would be to cut her wrist. States she has not been on any medications for about a year. States she has been having hallucinations.

## 2016-05-22 NOTE — ED Notes (Signed)
UNSUCCESSFUL ATTEMPT TO COLLECT BLOOD SAMPLES 

## 2016-05-23 ENCOUNTER — Other Ambulatory Visit: Payer: Self-pay

## 2016-05-23 ENCOUNTER — Inpatient Hospital Stay (HOSPITAL_COMMUNITY)
Admission: EM | Admit: 2016-05-23 | Discharge: 2016-05-27 | DRG: 885 | Disposition: A | Discharge status: Home / Self Care | Payer: Federal, State, Local not specified - Other | Source: Intra-hospital | Attending: Family Medicine | Admitting: Family Medicine

## 2016-05-23 ENCOUNTER — Encounter (HOSPITAL_COMMUNITY): Payer: Self-pay | Admitting: *Deleted

## 2016-05-23 DIAGNOSIS — R51 Headache: Secondary | ICD-10-CM | POA: Diagnosis present

## 2016-05-23 DIAGNOSIS — Z79899 Other long term (current) drug therapy: Secondary | ICD-10-CM | POA: Diagnosis not present

## 2016-05-23 DIAGNOSIS — F250 Schizoaffective disorder, bipolar type: Principal | ICD-10-CM | POA: Diagnosis present

## 2016-05-23 DIAGNOSIS — R7881 Bacteremia: Secondary | ICD-10-CM | POA: Diagnosis present

## 2016-05-23 DIAGNOSIS — Z62810 Personal history of physical and sexual abuse in childhood: Secondary | ICD-10-CM | POA: Diagnosis present

## 2016-05-23 DIAGNOSIS — F419 Anxiety disorder, unspecified: Secondary | ICD-10-CM | POA: Diagnosis present

## 2016-05-23 DIAGNOSIS — F17210 Nicotine dependence, cigarettes, uncomplicated: Secondary | ICD-10-CM | POA: Diagnosis not present

## 2016-05-23 DIAGNOSIS — R000 Tachycardia, unspecified: Secondary | ICD-10-CM | POA: Diagnosis present

## 2016-05-23 DIAGNOSIS — Z590 Homelessness: Secondary | ICD-10-CM | POA: Diagnosis not present

## 2016-05-23 DIAGNOSIS — F1990 Other psychoactive substance use, unspecified, uncomplicated: Secondary | ICD-10-CM

## 2016-05-23 DIAGNOSIS — F1290 Cannabis use, unspecified, uncomplicated: Secondary | ICD-10-CM | POA: Diagnosis not present

## 2016-05-23 DIAGNOSIS — R45851 Suicidal ideations: Secondary | ICD-10-CM | POA: Diagnosis not present

## 2016-05-23 HISTORY — DX: Other psychoactive substance use, unspecified, uncomplicated: F19.90

## 2016-05-23 MED ORDER — NICOTINE 21 MG/24HR TD PT24
21.0000 mg | MEDICATED_PATCH | Freq: Every day | TRANSDERMAL | Status: DC
  Administered 2016-05-24 – 2016-05-26 (×3): 21 mg via TRANSDERMAL
  Filled 2016-05-23 (×5): qty 1

## 2016-05-23 MED ORDER — LORAZEPAM 1 MG PO TABS
1.0000 mg | ORAL_TABLET | Freq: Four times a day (QID) | ORAL | Status: DC | PRN
  Administered 2016-05-24: 1 mg via ORAL
  Filled 2016-05-23 (×2): qty 1

## 2016-05-23 MED ORDER — TRAZODONE HCL 100 MG PO TABS: 100.0000 mg | ORAL_TABLET | Freq: Every evening | ORAL | Status: DC | PRN

## 2016-05-23 MED ORDER — CARBAMAZEPINE 200 MG PO TABS
200.0000 mg | ORAL_TABLET | Freq: Three times a day (TID) | ORAL | Status: DC
  Administered 2016-05-23: 200 mg via ORAL
  Filled 2016-05-23: qty 1

## 2016-05-23 MED ORDER — HYDROXYZINE HCL 25 MG PO TABS
25.0000 mg | ORAL_TABLET | Freq: Four times a day (QID) | ORAL | Status: AC | PRN
  Administered 2016-05-24 – 2016-05-26 (×7): 25 mg via ORAL
  Filled 2016-05-23 (×7): qty 1

## 2016-05-23 MED ORDER — TRAZODONE HCL 100 MG PO TABS
100.0000 mg | ORAL_TABLET | Freq: Every evening | ORAL | Status: DC | PRN
  Administered 2016-05-23 – 2016-05-27 (×4): 100 mg via ORAL
  Filled 2016-05-23 (×3): qty 1

## 2016-05-23 MED ORDER — ADULT MULTIVITAMIN W/MINERALS CH
1.0000 | ORAL_TABLET | Freq: Every day | ORAL | Status: DC
  Administered 2016-05-24 – 2016-05-26 (×3): 1 via ORAL
  Filled 2016-05-23 (×7): qty 1

## 2016-05-23 MED ORDER — LORAZEPAM 1 MG PO TABS: 1.0000 mg | ORAL_TABLET | Freq: Every day | ORAL | Status: DC

## 2016-05-23 MED ORDER — OLANZAPINE 5 MG PO TABS
5.0000 mg | ORAL_TABLET | Freq: Four times a day (QID) | ORAL | Status: DC | PRN
  Administered 2016-05-23: 5 mg via ORAL
  Filled 2016-05-23: qty 2

## 2016-05-23 MED ORDER — INFLUENZA VAC SPLIT QUAD 0.5 ML IM SUSY
0.5000 mL | PREFILLED_SYRINGE | INTRAMUSCULAR | Status: AC
  Administered 2016-05-24: 0.5 mL via INTRAMUSCULAR
  Filled 2016-05-23: qty 0.5

## 2016-05-23 MED ORDER — ACETAMINOPHEN 325 MG PO TABS
650.0000 mg | ORAL_TABLET | Freq: Four times a day (QID) | ORAL | Status: DC | PRN
  Administered 2016-05-23 (×2): 650 mg via ORAL
  Filled 2016-05-23 (×2): qty 2

## 2016-05-23 MED ORDER — PNEUMOCOCCAL VAC POLYVALENT 25 MCG/0.5ML IJ INJ
0.5000 mL | INJECTION | INTRAMUSCULAR | Status: AC
  Administered 2016-05-24: 0.5 mL via INTRAMUSCULAR

## 2016-05-23 MED ORDER — LORAZEPAM 1 MG PO TABS: 1.0000 mg | ORAL_TABLET | Freq: Two times a day (BID) | ORAL | Status: DC

## 2016-05-23 MED ORDER — ZIPRASIDONE HCL 20 MG PO CAPS
20.0000 mg | ORAL_CAPSULE | Freq: Two times a day (BID) | ORAL | Status: DC
  Administered 2016-05-23: 20 mg via ORAL
  Filled 2016-05-23: qty 1

## 2016-05-23 MED ORDER — ZIPRASIDONE HCL 20 MG PO CAPS: 40.0000 mg | ORAL_CAPSULE | Freq: Two times a day (BID) | ORAL | Status: DC

## 2016-05-23 MED ORDER — ONDANSETRON 4 MG PO TBDP: 4.0000 mg | ORAL_TABLET | Freq: Four times a day (QID) | ORAL | Status: AC | PRN

## 2016-05-23 MED ORDER — CARBAMAZEPINE 200 MG PO TABS
200.0000 mg | ORAL_TABLET | Freq: Three times a day (TID) | ORAL | Status: DC
  Administered 2016-05-23 – 2016-05-24 (×4): 200 mg via ORAL
  Filled 2016-05-23 (×8): qty 1

## 2016-05-23 MED ORDER — LORAZEPAM 1 MG PO TABS
1.0000 mg | ORAL_TABLET | Freq: Four times a day (QID) | ORAL | Status: AC
  Administered 2016-05-23 – 2016-05-25 (×6): 1 mg via ORAL
  Filled 2016-05-23 (×5): qty 1

## 2016-05-23 MED ORDER — THIAMINE HCL 100 MG/ML IJ SOLN: 100.0000 mg | Freq: Once | INTRAMUSCULAR | Status: DC

## 2016-05-23 MED ORDER — TRAZODONE HCL 100 MG PO TABS: 100.0000 mg | ORAL_TABLET | Freq: Every day | ORAL | Status: DC

## 2016-05-23 MED ORDER — LORAZEPAM 1 MG PO TABS: 1.0000 mg | ORAL_TABLET | Freq: Three times a day (TID) | ORAL | Status: DC

## 2016-05-23 MED ORDER — VITAMIN B-1 100 MG PO TABS
100.0000 mg | ORAL_TABLET | Freq: Every day | ORAL | Status: DC
  Administered 2016-05-24 – 2016-05-27 (×4): 100 mg via ORAL
  Filled 2016-05-23 (×7): qty 1

## 2016-05-23 MED ORDER — LOPERAMIDE HCL 2 MG PO CAPS: 2.0000 mg | ORAL_CAPSULE | ORAL | Status: AC | PRN

## 2016-05-23 MED ORDER — NICOTINE 21 MG/24HR TD PT24
21.0000 mg | MEDICATED_PATCH | Freq: Once | TRANSDERMAL | Status: DC
  Administered 2016-05-23: 21 mg via TRANSDERMAL
  Filled 2016-05-23: qty 1

## 2016-05-23 MED ORDER — MAGNESIUM HYDROXIDE 400 MG/5ML PO SUSP: 30.0000 mL | Freq: Every day | ORAL | Status: DC | PRN

## 2016-05-23 MED ORDER — HYDROXYZINE HCL 25 MG PO TABS
25.0000 mg | ORAL_TABLET | Freq: Four times a day (QID) | ORAL | Status: DC | PRN
  Administered 2016-05-23: 25 mg via ORAL
  Filled 2016-05-23: qty 1

## 2016-05-23 NOTE — Tx Team (Addendum)
Initial Treatment Plan 05/23/2016 1:56 PM [name] [id number]    PATIENT STRESSORS: Financial difficulties Substance abuse   PATIENT STRENGTHS: Manufacturing systems engineerCommunication skills Physical Health   PATIENT IDENTIFIED PROBLEMS: Depression  Auditory Hallucinations  Visual Hallucinations  Anxiety  Alcohol abuse  Suicidal Ideation           DISCHARGE CRITERIA:  Improved stabilization in mood, thinking, and/or behavior Need for constant or close observation no longer present Withdrawal symptoms are absent or subacute and managed without 24-hour nursing intervention  PRELIMINARY DISCHARGE PLAN: Attend 12-step recovery group Outpatient therapy  PATIENT/FAMILY INVOLVEMENT: This treatment plan has been presented to and reviewed with the patient, Carol NovemberStaci Morris.  The patient and family have been given the opportunity to ask questions and make suggestions.  Cranford MonBeaudry, Eugenio Dollins Evans, RN 05/23/2016, 1:56 PM

## 2016-05-23 NOTE — ED Notes (Signed)
[facility] wrist with knife and AVH. Patient denies HI. Plan of care discussed. Encouragement and support provided and safety maintain. Q 15 min safety checks in place and video monitoring.

## 2016-05-23 NOTE — ED Notes (Signed)
Pt transported to [facility] by [facility]. All belongings returned to pt who signed for same.  

## 2016-05-23 NOTE — Progress Notes (Signed)
Admission note:  Patient is a 36 yo female [facility], depression and alcohol abuse.  Patient states that she has not had a drink since Friday of last week and reports no withdrawal symptoms.  Patient stated during admission she drinks "6 to 12 beers daily with occasional liquor and marijuana use."  Patient states she has been to rehab in the past, but could not remember the name.  Patient is currently on disability and [place] who is supportive.  Patient states "I've been diagnosed with schizophrenia and I hear voices.  The voices tell me I'm stupid and worthless and that I should end it all."  Patient states that voices "have really bad since January."  Patient is pleasant and cooperative.  She states she has a hx of cutting and last cut in February.  Her skin search only revealed some tatoos and piercings.  Patient also reports visual hallucinations of "see flashes."  She has no pertinent medical hx.  Patient reports a significant weight loss from 300 to 219 lbs. In one year.  She reports crying spells, lethargy, self harm behaviors and suicidal ideation to cut herself "deep."  Patient was oriented to room and unit and given lunch and fluids.

## 2016-05-23 NOTE — BH Assessment (Signed)
[facility] Assessment Progress Note  Per Donell SievertSpencer Simon, PA, this pt requires psychiatric hospitalization at this time.  Clint Bolderori Beck, RN, [facility].  Pt has signed Voluntary Admission and Consent for Treatment, as well as Consent to Release Information to no one, and signed [facility].  Pt's nurse, Diane, has been notified, and agrees to send original paperwork along with pt via [facility], and to call report to [phone].  Doylene Canninghomas Ragna Kramlich, MA Triage Specialist [phone]

## 2016-05-23 NOTE — ED Notes (Signed)
[facility] [place] [facility].

## 2016-05-23 NOTE — ED Notes (Signed)
Patient escorted to shower for privacy to perform safety check for contraband and none found. Patient educated about search process and term "contraband " and routine search performed. No contraband found. 
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
## 2016-05-24 ENCOUNTER — Encounter (HOSPITAL_COMMUNITY): Payer: Self-pay | Admitting: Psychiatry

## 2016-05-24 DIAGNOSIS — Z79899 Other long term (current) drug therapy: Secondary | ICD-10-CM

## 2016-05-24 DIAGNOSIS — F1990 Other psychoactive substance use, unspecified, uncomplicated: Secondary | ICD-10-CM

## 2016-05-24 DIAGNOSIS — F250 Schizoaffective disorder, bipolar type: Principal | ICD-10-CM

## 2016-05-24 DIAGNOSIS — R45851 Suicidal ideations: Secondary | ICD-10-CM

## 2016-05-24 HISTORY — DX: Other psychoactive substance use, unspecified, uncomplicated: F19.90

## 2016-05-24 MED ORDER — CITALOPRAM HYDROBROMIDE 10 MG PO TABS
10.0000 mg | ORAL_TABLET | Freq: Every day | ORAL | Status: DC
  Administered 2016-05-24: 10 mg via ORAL
  Filled 2016-05-24 (×4): qty 1

## 2016-05-24 MED ORDER — CITALOPRAM HYDROBROMIDE 20 MG PO TABS
20.0000 mg | ORAL_TABLET | Freq: Every day | ORAL | Status: DC
  Administered 2016-05-25: 20 mg via ORAL
  Filled 2016-05-24 (×2): qty 1

## 2016-05-24 MED ORDER — ENSURE ENLIVE PO LIQD
237.0000 mL | ORAL | Status: DC
  Administered 2016-05-24 – 2016-05-26 (×3): 237 mL via ORAL

## 2016-05-24 MED ORDER — CHLORPROMAZINE HCL 50 MG PO TABS
50.0000 mg | ORAL_TABLET | Freq: Every day | ORAL | Status: DC
  Administered 2016-05-24: 50 mg via ORAL
  Filled 2016-05-24 (×3): qty 1

## 2016-05-24 MED ORDER — CHLORPROMAZINE HCL 25 MG PO TABS
25.0000 mg | ORAL_TABLET | Freq: Two times a day (BID) | ORAL | Status: DC | PRN
  Administered 2016-05-25 – 2016-05-26 (×4): 25 mg via ORAL
  Filled 2016-05-24 (×5): qty 1
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
## 2016-05-24 NOTE — Progress Notes (Signed)
Recreation Therapy Notes  Date: 05/24/16 Time: 0930 Location: [facility] Dayroom  Group Topic: Stress Management  Goal Area(s) Addresses:  Patient will verbalize importance of using healthy stress management.  Patient will identify positive emotions associated with healthy stress management.   Behavioral Response: Engaged  Intervention: Stress Management  Activity :  Body Scan Meditation.  LRT introduced the stress management technique of meditation.  LRT played a meditation that allowed patients to focus on the feelings and sensations they were feeling.  Patients were to follow along as the meditation played to engage in the activity.  Education:  Stress Management, Discharge Planning.   Education Outcome: Acknowledges edcuation/In group clarification offered/Needs additional education  Clinical Observations/Feedback: Pt attended group.   Caroll RancherMarjette Soraya Paquette, LRT/CTRS         Caroll RancherLindsay, Prabhav Faulkenberry A 05/24/2016 12:19 PM

## 2016-05-24 NOTE — Progress Notes (Signed)
Pt attended NA meeting this evening.  

## 2016-05-24 NOTE — Progress Notes (Signed)
NUTRITION ASSESSMENT  Pt identified as at risk on the Malnutrition Screen Tool  INTERVENTION: 1. Educated patient on the importance of nutrition and encouraged intake of food and beverages. 2. Discussed weight goals. 3. Supplements: will order Ensure Enlive one/day, this supplement provides 350 kcal and 20 grams of protein  NUTRITION DIAGNOSIS: Unintentional weight loss related to sub-optimal intake as evidenced by pt report.   Goal: Pt to meet >/= 90% of their estimated nutrition needs.  Monitor:  PO intake  Assessment:  Pt admitted for SI with a plan to cut her wrist and for auditory hallucinations; hallucinations have been worsening over the past 2 months. Pt with hx of anxiety, depression, and schizoaffective disorder not on any medications x1 year and is not followed by a psychiatrist. Pt reports to usually drinking 6-12 beers/day and occasional liquor and marijuana use.   No recent weight hx avaiblable. She reports that 1 year ago she weighed 300 lbs. This would indicate 81 lb weight loss (27% body weight), which is significant for time frame.  Will order Ensure Enlive once/day.Multivitamin with minerals has already been ordered. Continue to encourage PO intakes of meals, supplement, and snacks.   36 y.o. female  Height: Ht Readings from Last 1 Encounters:  05/23/16 5' 5.25" (1.657 m)    Weight: Wt Readings from Last 1 Encounters:  05/23/16 219 lb (99.3 kg)    Weight Hx: Wt Readings from Last 10 Encounters:  05/23/16 219 lb (99.3 kg)  05/30/11 246 lb 8 oz (111.8 kg)    BMI:  Body mass index is 36.16 kg/m. Pt meets criteria for obesity based on current BMI.  Estimated Nutritional Needs: Kcal: 25-30 kcal/kg Protein: > 1 gram protein/kg Fluid: 1 ml/kcal  Diet Order: Diet regular Room service appropriate? Yes; Fluid consistency: Thin Pt is also offered choice of unit snacks mid-morning and mid-afternoon.  Pt is eating as desired.   Lab results and medications  reviewed.    Trenton GammonJessica Damante Spragg, MS, RD, LDN, [facility] Inpatient Clinical Dietitian Pager # [phone] After hours/weekend pager # [phone]

## 2016-05-24 NOTE — [facility] Group Notes (Signed)
[facility] LCSW Group Therapy 05/24/2016 1:15 PM  Type of Therapy: Group Therapy- Emotion Regulation  Participation Level: Pt left at the beginning of group and did not return. Did not participate in group discussion while she was present.   Jonathon JordanLynn B Sherilee Smotherman, MSW, LCSWA 05/24/2016 4:05 PM

## 2016-05-24 NOTE — Plan of Care (Signed)
Problem: Safety: Goal: Periods of time without injury will increase Outcome: Progressing Pt. remains a moderate fall risk, endorses passive SI but verbal contracts and AVH, Q 15 checks in effect.

## 2016-05-24 NOTE — Progress Notes (Signed)
D: Patient c/o headache across forehead that has been persistent for a day, unrelieved by Tylenol.  A: Aromatherapy for headache - one drop lavender and one drop peppermint in FCO, applied to pt. temples and base of skull. Patient instructed to repeat as needed.  R: Patient sitting in dayroom with cup of coffee.

## 2016-05-24 NOTE — Progress Notes (Signed)
  DATA ACTION RESPONSE  Objective- Pt. is visible in the milieu, seen pacing the halls. Pt. presents with an agitated/irritable/anxious/depressed affect and mood.  Subjective- Endorses passive SI but verbal contracts. +AVH stating "I hear voices telling me that I am no good and that I be better off if I die" and " I see flashes at the corner of my eye". Denies Pain/HI. Pt. states " I talk to everyone and I feel no one is listening me; I want to get my medications right. I have nightmares at night". Pt was able to calm down after receving bedtime meds and writer address concerns.  Continues to remain safe on the unit.  1:1 interaction in private to establish rapport. Encouragement, education, & support given from staff. Meds. ordered and administered. PRN vistaril and Trazodone requested and will re-eval accordingly. Awaiting urine sample. Pending results. Pt. gave a note to Clinical research associatewriter, see chart.     Safety maintained with Q 15 checks. Continues to follow treatment plan and will monitor closely. No additonal questions/concerns noted.
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
## 2016-05-24 NOTE — Progress Notes (Signed)
Nursing Progress Note: 7p-7a D: Pt currently presents with a anxious/tremulous affect and behavior. Pt states "my anxiety is through the roof. I'm seeing shadows and hearing voices telling me to hurt myself and I would be better off dead." Interacting minimally with milieu. Pt reports off and on sleep at home.   A: Pt provided with medications per providers orders. Pt's labs and vitals were monitored throughout the night. Pt supported emotionally and encouraged to express concerns and questions. Pt educated on medications.  R: Pt's safety ensured with 15 minute and environmental checks. Pt currently denies HI and endorses passive SI/Self Harm and AVH. Pt verbally contracts to seek staff if SI/HI or A/VH occurs and to consult with staff before acting on any harmful thoughts. Will continue to monitor.
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
## 2016-05-25 LAB — CBC WITH DIFFERENTIAL/PLATELET
BASOS ABS: 0 10*3/uL (ref 0.0–0.1)
Basophils Relative: 0 %
Eosinophils Absolute: 0.4 10*3/uL (ref 0.0–0.7)
Eosinophils Relative: 3 %
HEMATOCRIT: 40.1 % (ref 36.0–46.0)
HEMOGLOBIN: 13.5 g/dL (ref 12.0–15.0)
LYMPHS PCT: 21 %
Lymphs Abs: 3 10*3/uL (ref 0.7–4.0)
MCH: 31.8 pg (ref 26.0–34.0)
MCHC: 33.7 g/dL (ref 30.0–36.0)
MCV: 94.6 fL (ref 78.0–100.0)
Monocytes Absolute: 0.8 10*3/uL (ref 0.1–1.0)
Monocytes Relative: 6 %
Neutro Abs: 9.9 10*3/uL — ABNORMAL HIGH (ref 1.7–7.7)
Neutrophils Relative %: 70 %
Platelets: 254 10*3/uL (ref 150–400)
RBC: 4.24 MIL/uL (ref 3.87–5.11)
RDW: 13.5 % (ref 11.5–15.5)
WBC: 14.1 10*3/uL — AB (ref 4.0–10.5)

## 2016-05-25 LAB — COMPREHENSIVE METABOLIC PANEL
ALT: 15 U/L (ref 14–54)
AST: 19 U/L (ref 15–41)
Albumin: 4 g/dL (ref 3.5–5.0)
Alkaline Phosphatase: 70 U/L (ref 38–126)
Anion gap: 8 (ref 5–15)
BILIRUBIN TOTAL: 0.2 mg/dL — AB (ref 0.3–1.2)
BUN: 16 mg/dL (ref 6–20)
CO2: 26 mmol/L (ref 22–32)
CREATININE: 0.73 mg/dL (ref 0.44–1.00)
Calcium: 8.9 mg/dL (ref 8.9–10.3)
Chloride: 100 mmol/L — ABNORMAL LOW (ref 101–111)
GFR calc Af Amer: >60 mL/min (ref >60)
Glucose, Bld: 161 mg/dL — ABNORMAL HIGH (ref 65–99)
Potassium: 4 mmol/L (ref 3.5–5.1)
Sodium: 134 mmol/L — ABNORMAL LOW (ref 135–145)
TOTAL PROTEIN: 7.2 g/dL (ref 6.5–8.1)

## 2016-05-25 LAB — GC/CHLAMYDIA PROBE AMP (~~LOC~~) NOT AT ARMC
CHLAMYDIA, DNA PROBE: NEGATIVE
NEISSERIA GONORRHEA: NEGATIVE

## 2016-05-25 LAB — SEDIMENTATION RATE: SED RATE: 19 mm/h (ref 0–22)

## 2016-05-25 MED ORDER — CHLORPROMAZINE HCL 100 MG PO TABS
100.0000 mg | ORAL_TABLET | Freq: Every day | ORAL | Status: DC
  Administered 2016-05-25: 100 mg via ORAL
  Filled 2016-05-25 (×2): qty 1

## 2016-05-25 MED ORDER — SERTRALINE HCL 50 MG PO TABS
50.0000 mg | ORAL_TABLET | Freq: Every day | ORAL | Status: DC
  Administered 2016-05-26: 50 mg via ORAL
  Filled 2016-05-25 (×2): qty 1

## 2016-05-25 MED ORDER — ACETAMINOPHEN 325 MG PO TABS
650.0000 mg | ORAL_TABLET | Freq: Four times a day (QID) | ORAL | Status: DC | PRN
  Administered 2016-05-25 – 2016-05-26 (×2): 650 mg via ORAL
  Filled 2016-05-25 (×2): qty 2

## 2016-05-25 MED ORDER — LORAZEPAM 0.5 MG PO TABS
0.5000 mg | ORAL_TABLET | Freq: Two times a day (BID) | ORAL | Status: DC
  Administered 2016-05-25 – 2016-05-26 (×4): 0.5 mg via ORAL
  Filled 2016-05-25 (×4): qty 1

## 2016-05-25 NOTE — Progress Notes (Signed)
[facility] MD Progress Note  05/25/2016 10:53 AM [name]  MRN:  [id number] Subjective:   36 yo Caucasian female, single, homeless, on SSI. Background history of schizoaffective disorder and SUD (Alcohol and THC). Presented to the ER via the police. Admitted voluntarily. Patient called 911 herself as she was overwhelmed. She reports worsening suicidal thoughts. She reported command hallucination to slit her wrist. She had been off her psychotropic medications for over a year. BAL was negative. UDS was positive for THC.  Nursing staff reports that she has been having tachycardia. Blood pressure dropped in past 24 hours. She has been dysphoric and tearful. Patient complains of headache and general body aches. Minimal participation with unit activities.   At interview, tells me that she is not feeling good physically and mentally. Says physically she feels drained. She reports that the hallucinations are less but tere are still there. She continues to feel hopeless and worthless. States that she did not sleep last night.   Recent CBC shows leucocytosis. We are awaiting urine microscopy and chlamydia studies.   Principal Problem: Schizoaffective disorder, bipolar type ([place]) Diagnosis:   Patient Active Problem List   Diagnosis Date Noted  . Substance use disorder [F19.90] 05/24/2016  . Schizoaffective disorder, bipolar type ([place]) [F25.0] 05/31/2011   Total Time spent with patient: 30 minutes  Past Psychiatric History: As in H&P  Past Medical History:  Past Medical History:  Diagnosis Date  . Anemia   . Anxiety   . Asthma   . Depression   . Headache(784.0)   . Pneumonia   . Substance use disorder 05/24/2016    Past Surgical History:  Procedure Laterality Date  . CESAREAN SECTION  2006  . THYROID SURGERY  2003   Family History: History reviewed. No pertinent family history. Family Psychiatric  History: As in H&P Social History:  History  Alcohol Use  . 10.2 oz/week  . 12 Cans of  beer, 2 Glasses of wine, 3 Shots of liquor per week     History  Drug Use  . Types: Marijuana    Comment: THC used once last month    Social History   Social History  . Marital status: Single    Spouse name: N/A  . Number of children: N/A  . Years of education: N/A   Social History Main Topics  . Smoking status: Current Every Day Smoker    Packs/day: 0.50  . Smokeless tobacco: Never Used  . Alcohol use 10.2 oz/week    12 Cans of beer, 2 Glasses of wine, 3 Shots of liquor per week  . Drug use: Yes    Types: Marijuana     Comment: THC used once last month  . Sexual activity: Yes    Birth control/ protection: Injection     Comment: Depo--thinks she due for her next shot this month/ LMP unable to recall   Other Topics Concern  . None   Social History Narrative  . None   Additional Social History:      Sleep: Poor  Appetite:  Poor  Current Medications: Current Facility-Administered Medications  Medication Dose Route Frequency Provider Last Rate Last Dose  . acetaminophen (TYLENOL) tablet 650 mg  650 mg Oral Q6H PRN Artist Beach, MD      . chlorproMAZINE (THORAZINE) tablet 25 mg  25 mg Oral BID PRN Artist Beach, MD   25 mg at 05/25/16 0818  . chlorproMAZINE (THORAZINE) tablet 50 mg  50 mg Oral QHS Vincent A  Izediuno, MD   50 mg at 05/24/16 2125  . citalopram (CELEXA) tablet 20 mg  20 mg Oral Daily Encarnacion Slates, NP   20 mg at 05/25/16 0754  . feeding supplement (ENSURE ENLIVE) (ENSURE ENLIVE) liquid 237 mL  237 mL Oral Q24H Myer Peer Cobos, MD   237 mL at 05/24/16 1430  . hydrOXYzine (ATARAX/VISTARIL) tablet 25 mg  25 mg Oral Q6H PRN Laverle Hobby, PA-C   25 mg at 05/24/16 2125  . loperamide (IMODIUM) capsule 2-4 mg  2-4 mg Oral PRN Laverle Hobby, PA-C      . LORazepam (ATIVAN) tablet 0.5 mg  0.5 mg Oral BID Artist Beach, MD      . magnesium hydroxide (MILK OF MAGNESIA) suspension 30 mL  30 mL Oral Daily PRN Patrecia Pour, NP      . multivitamin  with minerals tablet 1 tablet  1 tablet Oral Daily Laverle Hobby, PA-C   1 tablet at 05/25/16 0754  . nicotine (NICODERM CQ - dosed in mg/24 hours) patch 21 mg  21 mg Transdermal Daily Artist Beach, MD   21 mg at 05/25/16 0753  . ondansetron (ZOFRAN-ODT) disintegrating tablet 4 mg  4 mg Oral Q6H PRN Laverle Hobby, PA-C      . thiamine (B-1) injection 100 mg  100 mg Intramuscular Once [employer], PA-C      . thiamine (VITAMIN B-1) tablet 100 mg  100 mg Oral Daily Laverle Hobby, PA-C   100 mg at 05/25/16 0754  . traZODone (DESYREL) tablet 100 mg  100 mg Oral QHS PRN Patrecia Pour, NP   100 mg at 05/24/16 2125    Lab Results: No results found for this or any previous visit (from the past 48 hour(s)).  Blood Alcohol level:  Lab Results  Component Value Date   ETH <5 05/22/2016   ETH <11 74/94/4967    Metabolic Disorder Labs: No results found for: HGBA1C, MPG No results found for: PROLACTIN No results found for: CHOL, TRIG, HDL, CHOLHDL, VLDL, LDLCALC  Physical Findings: AIMS: Facial and Oral Movements Muscles of Facial Expression: None, normal Lips and Perioral Area: None, normal Jaw: None, normal Tongue: None, normal,Extremity Movements Upper (arms, wrists, hands, fingers): None, normal Lower (legs, knees, ankles, toes): None, normal, Trunk Movements Neck, shoulders, hips: None, normal, Overall Severity Severity of abnormal movements (highest score from questions above): None, normal Incapacitation due to abnormal movements: None, normal Patient's awareness of abnormal movements (rate only patient's report): No Awareness, Dental Status Current problems with teeth and/or dentures?: No Does patient usually wear dentures?: No  CIWA:  CIWA-Ar Total: 7 COWS:     Musculoskeletal: Strength & Muscle Tone: within normal limits Gait & Station: normal Patient leans: N/A  Psychiatric Specialty Exam: Physical Exam  Constitutional: She is oriented to person, place, and  time. She appears well-developed and well-nourished.  HENT:  Head: Atraumatic.  Eyes: Conjunctivae and EOM are normal. Pupils are equal, round, and reactive to light.  Neck: Normal range of motion.  Cardiovascular: Regular rhythm.   Respiratory: Effort normal and breath sounds normal.  Musculoskeletal: Normal range of motion.  Neurological: She is alert and oriented to person, place, and time.  Skin: Skin is warm and dry.  Psychiatric:  As above    ROS  Blood pressure 118/69, pulse (!) 131, temperature 98.3 F (36.8 C), temperature source Oral, resp. rate 18, height 5' 5.25" (1.657 m), weight 99.3 kg (219  lb).Body mass index is 36.16 kg/m.  General Appearance:  Overweight, tired and acute ill looking. Withdrawn. Febrile to touch, dry skin, no tremors  Eye Contact:  Good  Speech:  Slow  Volume:  Decreased  Mood:  Depressed, Dysphoric and Worthless  Affect:  Blunted  Thought Process:  Linear  Orientation:  Full (Time, Place, and Person)  Thought Content:  Hopelessness, worthlessness  Suicidal Thoughts:  Yes.  without intent/plan  Homicidal Thoughts:  No  Memory:  Did not assess  Judgement:  Poor  Insight:  Good  Psychomotor Activity:  Decreased  Concentration:  Impaired   Recall:  Did not assess  Fund of Knowledge:  Fair  Language:  Good  Akathisia:  No  Handed:    AIMS (if indicated):     Assets:  Desire for Improvement  ADL's:  Impaired  Cognition:  Impaired,  Mild  Sleep:  Number of Hours: 6.75     Treatment Plan Summary:  Patient seems to have underlying infective process. She is still dysphoric and hallucinating. We plan to work her up organically and adjust her medications as below. We reviewed risks and benefits of Sertraline. Patient consented to switch to this agent.   Psychiatric: Schizoaffective disorder SUD (alcohol and THC)  Medical: Obesity ? Septicemia   Psychosocial:  Traumatic life experiences Homelessness Poor relationship with her  family  PLAN: 1. Blood culture, CBC, CMP, ESR 2. Increase Chlorpromazine to 100 mg HS 3. Vitals every four hours 4. Medical review once we have basic labs back 5. Switch Citalopram to Sertraline from tomorrow.  Artist Beach, MD 05/25/2016, 10:53 AM

## 2016-05-25 NOTE — Progress Notes (Signed)
D:Pt is agitated and anxious on the unit requesting medication. Pt is blaming others and upset with the doctors wanting more medication. Pt's pulse has been increased.  A:Offered support and 15 minute checks. Rechecked blood pressure and reported to MD. Gave medications as ordered.  R:Safety maintained on the unit.

## 2016-05-25 NOTE — Tx Team (Signed)
Interdisciplinary Treatment and Diagnostic Plan Update  05/25/2016 Time of Session: 0930 [name] MRN: [id number]  Principal Diagnosis: Schizoaffective disorder, bipolar type [facility])  Secondary Diagnoses: Principal Problem:   Schizoaffective disorder, bipolar type (HCC) Active Problems:   Substance use disorder   Current Medications:  Current Facility-Administered Medications  Medication Dose Route Frequency Provider Last Rate Last Dose  . acetaminophen (TYLENOL) tablet 650 mg  650 mg Oral Q6H PRN Charm Rings, NP   650 mg at 05/23/16 2206  . chlorproMAZINE (THORAZINE) tablet 25 mg  25 mg Oral BID PRN Georgiann Cocker, MD   25 mg at 05/25/16 0818  . chlorproMAZINE (THORAZINE) tablet 50 mg  50 mg Oral QHS Georgiann Cocker, MD   50 mg at 05/24/16 2125  . citalopram (CELEXA) tablet 20 mg  20 mg Oral Daily Sanjuana Kava, NP   20 mg at 05/25/16 0754  . feeding supplement (ENSURE ENLIVE) (ENSURE ENLIVE) liquid 237 mL  237 mL Oral Q24H Rockey Situ Cobos, MD   237 mL at 05/24/16 1430  . hydrOXYzine (ATARAX/VISTARIL) tablet 25 mg  25 mg Oral Q6H PRN Kerry Hough, PA-C   25 mg at 05/24/16 2125  . loperamide (IMODIUM) capsule 2-4 mg  2-4 mg Oral PRN Kerry Hough, PA-C      . LORazepam (ATIVAN) tablet 1 mg  1 mg Oral Q6H PRN Kerry Hough, PA-C   1 mg at 05/24/16 1814  . LORazepam (ATIVAN) tablet 1 mg  1 mg Oral TID Kerry Hough, PA-C       Followed by  . [START ON 05/26/2016] LORazepam (ATIVAN) tablet 1 mg  1 mg Oral BID Kerry Hough, PA-C       Followed by  . [START ON 05/28/2016] LORazepam (ATIVAN) tablet 1 mg  1 mg Oral Daily Spencer E Simon, PA-C      . magnesium hydroxide (MILK OF MAGNESIA) suspension 30 mL  30 mL Oral Daily PRN Charm Rings, NP      . multivitamin with minerals tablet 1 tablet  1 tablet Oral Daily Kerry Hough, PA-C   1 tablet at 05/25/16 0754  . nicotine (NICODERM CQ - dosed in mg/24 hours) patch 21 mg  21 mg Transdermal Daily Georgiann Cocker, MD    21 mg at 05/25/16 0753  . ondansetron (ZOFRAN-ODT) disintegrating tablet 4 mg  4 mg Oral Q6H PRN Kerry Hough, PA-C      . thiamine (B-1) injection 100 mg  100 mg Intramuscular Once [employer], PA-C      . thiamine (VITAMIN B-1) tablet 100 mg  100 mg Oral Daily Kerry Hough, PA-C   100 mg at 05/25/16 0754  . traZODone (DESYREL) tablet 100 mg  100 mg Oral QHS PRN Charm Rings, NP   100 mg at 05/24/16 2125   PTA Medications: No prescriptions prior to admission.    Patient Stressors: Financial difficulties Substance abuse  Patient Strengths: Manufacturing systems engineer Physical Health  Treatment Modalities: Medication Management, Group therapy, Case management,  1 to 1 session with clinician, Psychoeducation, Recreational therapy.   Physician Treatment Plan for Primary Diagnosis: Schizoaffective disorder, bipolar type (HCC) Long Term Goal(s): Improvement in symptoms so as ready for discharge Improvement in symptoms so as ready for discharge   Short Term Goals: Ability to identify changes in lifestyle to reduce recurrence of condition will improve Ability to verbalize feelings will improve Ability to disclose and discuss suicidal ideas Ability  to demonstrate self-control will improve Ability to identify and develop effective coping behaviors will improve Ability to maintain clinical measurements within normal limits will improve Compliance with prescribed medications will improve Ability to identify triggers associated with substance abuse/mental health issues will improve  Medication Management: Evaluate patient's response, side effects, and tolerance of medication regimen.  Therapeutic Interventions: 1 to 1 sessions, Unit Group sessions and Medication administration.  Evaluation of Outcomes: Progressing  Physician Treatment Plan for Secondary Diagnosis: Principal Problem:   Schizoaffective disorder, bipolar type (HCC) Active Problems:   Substance use disorder  Long  Term Goal(s): Improvement in symptoms so as ready for discharge Improvement in symptoms so as ready for discharge   Short Term Goals: Ability to identify changes in lifestyle to reduce recurrence of condition will improve Ability to verbalize feelings will improve Ability to disclose and discuss suicidal ideas Ability to demonstrate self-control will improve Ability to identify and develop effective coping behaviors will improve Ability to maintain clinical measurements within normal limits will improve Compliance with prescribed medications will improve Ability to identify triggers associated with substance abuse/mental health issues will improve     Medication Management: Evaluate patient's response, side effects, and tolerance of medication regimen.  Therapeutic Interventions: 1 to 1 sessions, Unit Group sessions and Medication administration.  Evaluation of Outcomes: Progressing   RN Treatment Plan for Primary Diagnosis: Schizoaffective disorder, bipolar type (HCC) Long Term Goal(s): Knowledge of disease and therapeutic regimen to maintain health will improve  Short Term Goals: Ability to remain free from injury will improve, Ability to identify and develop effective coping behaviors will improve and Compliance with prescribed medications will improve  Medication Management: RN will administer medications as ordered by provider, will assess and evaluate patient's response and provide education to patient for prescribed medication. RN will report any adverse and/or side effects to prescribing provider.  Therapeutic Interventions: 1 on 1 counseling sessions, Psychoeducation, Medication administration, Evaluate responses to treatment, Monitor vital signs and CBGs as ordered, Perform/monitor CIWA, COWS, AIMS and Fall Risk screenings as ordered, Perform wound care treatments as ordered.  Evaluation of Outcomes: Progressing   LCSW Treatment Plan for Primary Diagnosis: Schizoaffective  disorder, bipolar type (HCC) Long Term Goal(s): Safe transition to appropriate next level of care at discharge, Engage patient in therapeutic group addressing interpersonal concerns.  Short Term Goals: Engage patient in aftercare planning with referrals and resources, Facilitate patient progression through stages of change regarding substance use diagnoses and concerns and Identify triggers associated with mental health/substance abuse issues  Therapeutic Interventions: Assess for all discharge needs, 1 to 1 time with Social worker, Explore available resources and support systems, Assess for adequacy in community support network, Educate family and significant other(s) on suicide prevention, Complete Psychosocial Assessment, Interpersonal group therapy.  Evaluation of Outcomes: Progressing  Progress in Treatment: Attending groups: Yes. Participating in groups: Yes. Taking medication as prescribed: Yes. Toleration medication: Yes. Family/Significant other contact made: No, will contact:  family member if patient consents Patient understands diagnosis: Yes. Discussing patient identified problems/goals with staff: Yes. Medical problems stabilized or resolved: Yes. Denies suicidal/homicidal ideation: Yes. Issues/concerns per patient self-inventory: No. Other: n/a  New problem(s) identified: No, Describe:  n/a  New Short Term/Long Term Goal(s):  Detox; medication stabilization; development of comprehensive mental wellness/sobriety plan.   Discharge Plan or Barriers: CSW assessing for appropriate referrals. Return home; [facility].   Reason for Continuation of Hospitalization: Anxiety Depression Medication stabilization Suicidal ideation Withdrawal symptoms  Estimated Length of Stay: 3-5 days   Attendees: Patient: 05/25/2016 8:56 AM  Physician: Dr. Jackquline BerlinIzediuno MD 05/25/2016 8:56 AM  Nursing:  05/25/2016 8:56 AM  RN Care Manager:  Onnie BoerJennifer Clark CM 05/25/2016 8:56 AM  Social Worker: Trula SladeHeather Smart, LCSW 05/25/2016 8:56 AM  Recreational Therapist: Juliann ParesX 05/25/2016 8:56 AM  Other: Armandina StammerAgnes Nwoko NP; Gray BernhardtMay Augustin NP 05/25/2016 8:56 AM  Other:  05/25/2016 8:56 AM  Other: 05/25/2016 8:56 AM    Scribe for Treatment Team: Ledell PeoplesHeather N Smart, LCSW 05/25/2016 8:56 AM

## 2016-05-25 NOTE — Plan of Care (Signed)
Problem: Activity: Goal: Interest or engagement in activities will improve Outcome: Progressing Pt has been out in the dayroom playing cards and interacting with peers

## 2016-05-25 NOTE — Progress Notes (Signed)
  Adult Psychoeducational Group Note  Date:  05/25/2016 Time:  9:00 am  Group Topic/Focus:  Orientation:   The focus of this group is to educate the patient on the purpose and policies of crisis stabilization and provide a format to answer questions about their admission.  The group details unit policies and expectations of patients while admitted.  Participation Level:  Did Not Attend  Participation Quality:    Affect:    Cognitive:    Insight:   Engagement in Group:    Modes of Intervention:    Additional Comments:   Nieves Chapa L 05/25/2016, 9:24 AM  

## 2016-05-25 NOTE — [facility] Group Notes (Signed)
[facility] LCSW Group Therapy  05/25/2016 1:50 PM  Type of Therapy:  Group Therapy  Participation Level:  Did Not Attend-pt invited. Chose to remain in bed.   Summary of Progress/Problems:  Finding Balance in Life. Today's group focused on defining balance in one's own words, identifying things that can knock one off balance, and exploring healthy ways to maintain balance in life. Group members were asked to provide an example of a time when they felt off balance, describe how they handled that situation,and process healthier ways to regain balance in the future. Group members were asked to share the most important tool for maintaining balance that they [facility] and how they plan to apply this method after discharge.   [name] N Smart  LCSW 05/25/2016, 1:50 PM

## 2016-05-25 NOTE — [facility] Suicide Risk Assessment (Signed)
[facility] INPATIENT:  Family/Significant Other Suicide Prevention Education  Suicide Prevention Education:  Education Completed; [name], [place], [phone] has been identified by the patient as the family member/significant other with whom the patient will be residing, and identified as the person(s) who will aid the patient in the event of a mental health crisis (suicidal ideations/suicide attempt).  With written consent from the patient, the family member/significant other has been provided the following suicide prevention education, prior to the and/or following the discharge of the patient.  The suicide prevention education provided includes the following:  Suicide risk factors  Suicide prevention and interventions  National Suicide Hotline telephone number  [facility] assessment telephone number  [facility] Emergency Assistance 911  [facility] and/or Residential Mobile Crisis Unit telephone number  Request made of family/significant other to:  Remove weapons (e.g., guns, rifles, knives), all items previously/currently identified as safety concern.    Remove drugs/medications (over-the-counter, prescriptions, illicit drugs), all items previously/currently identified as a safety concern.  The family member/significant other verbalizes understanding of the suicide prevention education information provided.  The family member/significant other agrees to remove the items of safety concern listed above.  [name] 05/25/2016, 8:58 AM
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
## 2016-05-25 NOTE — Progress Notes (Signed)
Nursing Progress Note: 7p-7a D: Pt currently presents with a depressed/anxious/tearful affect and behavior. Pt states "I'm at my whit's end. I feel like giving up no one listens to me. I need depression meds all through the day because the first dose wears off too quickly. I feel like standing in my shower till I drown. I haven't been able to interact that much because it's so hard for me to get out of bed." Interacting minimally with milieu. Pt reports good sleep with current medication regimen.   A: Pt provided with medications per providers orders. Pt's labs and vitals were monitored throughout the night. Pt supported emotionally and encouraged to express concerns and questions. Pt educated on medications.  R: Pt's safety ensured with 15 minute and environmental checks. Pt currently endorses passive SI and AVH and denies HI. Pt verbally contracts to seek staff if SI/HI or A/VH occurs and to consult with staff before acting on any harmful thoughts. Will continue to monitor.

## 2016-05-26 ENCOUNTER — Encounter (HOSPITAL_COMMUNITY): Payer: Self-pay | Admitting: Emergency Medicine

## 2016-05-26 LAB — BLOOD CULTURE ID PANEL (REFLEXED)
Acinetobacter baumannii: NOT DETECTED
CANDIDA KRUSEI: NOT DETECTED
CANDIDA PARAPSILOSIS: NOT DETECTED
CANDIDA TROPICALIS: NOT DETECTED
Candida albicans: NOT DETECTED
Candida glabrata: NOT DETECTED
Enterobacter cloacae complex: NOT DETECTED
Enterobacteriaceae species: NOT DETECTED
Enterococcus species: NOT DETECTED
Escherichia coli: NOT DETECTED
Haemophilus influenzae: NOT DETECTED
KLEBSIELLA OXYTOCA: NOT DETECTED
KLEBSIELLA PNEUMONIAE: NOT DETECTED
Listeria monocytogenes: NOT DETECTED
Methicillin resistance: DETECTED — AB
Neisseria meningitidis: NOT DETECTED
PROTEUS SPECIES: NOT DETECTED
Pseudomonas aeruginosa: NOT DETECTED
SERRATIA MARCESCENS: NOT DETECTED
STAPHYLOCOCCUS AUREUS BCID: NOT DETECTED
STAPHYLOCOCCUS SPECIES: DETECTED — AB
Streptococcus agalactiae: NOT DETECTED
Streptococcus pneumoniae: NOT DETECTED
Streptococcus pyogenes: NOT DETECTED
Streptococcus species: NOT DETECTED

## 2016-05-26 LAB — COMPREHENSIVE METABOLIC PANEL
ALT: 14 U/L (ref 14–54)
AST: 16 U/L (ref 15–41)
Albumin: 3.7 g/dL (ref 3.5–5.0)
Alkaline Phosphatase: 67 U/L (ref 38–126)
Anion gap: 7 (ref 5–15)
BILIRUBIN TOTAL: 0.3 mg/dL (ref 0.3–1.2)
BUN: 11 mg/dL (ref 6–20)
CALCIUM: 8.7 mg/dL — AB (ref 8.9–10.3)
CHLORIDE: 105 mmol/L (ref 101–111)
CO2: 26 mmol/L (ref 22–32)
Creatinine, Ser: 0.71 mg/dL (ref 0.44–1.00)
GLUCOSE: 96 mg/dL (ref 65–99)
Potassium: 4 mmol/L (ref 3.5–5.1)
Sodium: 138 mmol/L (ref 135–145)
TOTAL PROTEIN: 7 g/dL (ref 6.5–8.1)

## 2016-05-26 LAB — CBC WITH DIFFERENTIAL/PLATELET
Basophils Absolute: 0 10*3/uL (ref 0.0–0.1)
Basophils Relative: 0 %
Eosinophils Absolute: 0.4 10*3/uL (ref 0.0–0.7)
Eosinophils Relative: 4 %
HEMATOCRIT: 39 % (ref 36.0–46.0)
Hemoglobin: 13.4 g/dL (ref 12.0–15.0)
LYMPHS ABS: 3.3 10*3/uL (ref 0.7–4.0)
LYMPHS PCT: 32 %
MCH: 31.3 pg (ref 26.0–34.0)
MCHC: 34.4 g/dL (ref 30.0–36.0)
MCV: 91.1 fL (ref 78.0–100.0)
MONO ABS: 0.7 10*3/uL (ref 0.1–1.0)
MONOS PCT: 7 %
NEUTROS ABS: 5.7 10*3/uL (ref 1.7–7.7)
Neutrophils Relative %: 57 %
PLATELETS: 272 10*3/uL (ref 150–400)
RBC: 4.28 MIL/uL (ref 3.87–5.11)
RDW: 13.5 % (ref 11.5–15.5)
WBC: 10.1 10*3/uL (ref 4.0–10.5)

## 2016-05-26 LAB — SEDIMENTATION RATE: Sed Rate: 34 mm/hr — ABNORMAL HIGH (ref 0–22)

## 2016-05-26 LAB — URINE CULTURE

## 2016-05-26 MED ORDER — CHLORPROMAZINE HCL 25 MG PO TABS
25.0000 mg | ORAL_TABLET | Freq: Once | ORAL | Status: AC
  Administered 2016-05-26: 25 mg via ORAL
  Filled 2016-05-26 (×2): qty 1

## 2016-05-26 MED ORDER — SULFAMETHOXAZOLE-TRIMETHOPRIM 800-160 MG PO TABS
1.0000 | ORAL_TABLET | Freq: Two times a day (BID) | ORAL | Status: DC
  Administered 2016-05-26: 1 via ORAL
  Filled 2016-05-26 (×3): qty 1

## 2016-05-26 MED ORDER — SERTRALINE HCL 50 MG PO TABS
100.0000 mg | ORAL_TABLET | Freq: Every day | ORAL | Status: DC
  Filled 2016-05-26 (×2): qty 1

## 2016-05-26 MED ORDER — CHLORPROMAZINE HCL 25 MG PO TABS
200.0000 mg | ORAL_TABLET | Freq: Every day | ORAL | Status: DC
  Administered 2016-05-27: 200 mg via ORAL
  Filled 2016-05-26: qty 2
  Filled 2016-05-26: qty 8
  Filled 2016-05-26: qty 2
  Filled 2016-05-26: qty 8

## 2016-05-26 MED ORDER — SERTRALINE HCL 25 MG PO TABS
25.0000 mg | ORAL_TABLET | Freq: Once | ORAL | Status: AC
  Administered 2016-05-26: 25 mg via ORAL
  Filled 2016-05-26 (×2): qty 1

## 2016-05-26 NOTE — [facility] Group Notes (Signed)
[facility] LCSW Aftercare Discharge Planning Group Note   05/26/2016 11:01 AM  Participation Quality:  Appropriate   Mood/Affect:  Appropriate  Thoughts of Suicide:  No Will you contract for safety?   NA  Current AVH:  No-"They have faded and aren't bothering me so much anymore." pt appears to be at her baseline.   Plan for Discharge/Comments:  Pt plans to return home; [facility] and [facility] for medication management. [facility] information provided.   Transportation Means: bus pass in chart   Supports: family  [employer]

## 2016-05-26 NOTE — Progress Notes (Signed)
  [facility] Adult Case Management Discharge Plan :  Will you be returning to the same living situation after discharge:  Yes,  home At discharge, do you have transportation home?: Yes,  bus pass in chart Do you have the ability to pay for your medications: Yes,  [facility]  Release of information consent forms completed and submitted to medical records by CSW.  Patient to Follow up at: Follow-up Information    [facility] Follow up.   Specialty:  Behavioral Health Why:  Walk in between 8am-9am Monday through Friday for hospital follow-up/medication management.  [address] [place] [address] [phone]        [facility] Follow up on 05/31/2016.   Why:  Appt for counseling on Wednesday at 1:30PM-3:00PM. Please bring photo ID/Medicaid card to this appointment. Thank you.  Contact information: [facility] [place] [facility] [address], [place] [address] Phone: [phone] Fax: [phone]          Next level of care provider has access to [facility] Link:no  Safety Planning and Suicide Prevention discussed: Yes,  SPE completed with pt's fiance  Have you used any form of tobacco in the last 30 days? (Cigarettes, Smokeless Tobacco, Cigars, and/or Pipes): (P) Yes  Has patient been referred to the Quitline?: Patient refused referral  Patient has been referred for addiction treatment: Yes  Shawneen Deetz N Smart LCSW 05/26/2016, 11:03 AM

## 2016-05-26 NOTE — Tx Team (Signed)
Interdisciplinary Treatment and Diagnostic Plan Update  05/26/2016 Time of Session: 0930 [name] MRN: [id number]  Principal Diagnosis: Schizoaffective disorder, bipolar type [facility])  Secondary Diagnoses: Principal Problem:   Schizoaffective disorder, bipolar type ([place]) Active Problems:   Substance use disorder   Current Medications:  Current Facility-Administered Medications  Medication Dose Route Frequency Provider Last Rate Last Dose  . acetaminophen (TYLENOL) tablet 650 mg  650 mg Oral Q6H PRN Artist Beach, MD   650 mg at 05/25/16 1107  . chlorproMAZINE (THORAZINE) tablet 100 mg  100 mg Oral QHS Artist Beach, MD   100 mg at 05/25/16 2131  . chlorproMAZINE (THORAZINE) tablet 25 mg  25 mg Oral BID PRN Artist Beach, MD   25 mg at 05/26/16 0810  . feeding supplement (ENSURE ENLIVE) (ENSURE ENLIVE) liquid 237 mL  237 mL Oral Q24H Myer Peer Cobos, MD   237 mL at 05/26/16 0807  . hydrOXYzine (ATARAX/VISTARIL) tablet 25 mg  25 mg Oral Q6H PRN Laverle Hobby, PA-C   25 mg at 05/26/16 1025  . loperamide (IMODIUM) capsule 2-4 mg  2-4 mg Oral PRN Laverle Hobby, PA-C      . LORazepam (ATIVAN) tablet 0.5 mg  0.5 mg Oral BID Artist Beach, MD   0.5 mg at 05/26/16 0808  . magnesium hydroxide (MILK OF MAGNESIA) suspension 30 mL  30 mL Oral Daily PRN Patrecia Pour, NP      . multivitamin with minerals tablet 1 tablet  1 tablet Oral Daily Laverle Hobby, PA-C   1 tablet at 05/26/16 0810  . nicotine (NICODERM CQ - dosed in mg/24 hours) patch 21 mg  21 mg Transdermal Daily Artist Beach, MD   21 mg at 05/26/16 0808  . ondansetron (ZOFRAN-ODT) disintegrating tablet 4 mg  4 mg Oral Q6H PRN Laverle Hobby, PA-C      . sertraline (ZOLOFT) tablet 50 mg  50 mg Oral Daily Artist Beach, MD   50 mg at 05/26/16 0808  . thiamine (B-1) injection 100 mg  100 mg Intramuscular Once [employer], PA-C      . thiamine (VITAMIN B-1) tablet 100 mg  100 mg Oral Daily Laverle Hobby, PA-C   100 mg at 05/26/16 0808  . traZODone (DESYREL) tablet 100 mg  100 mg Oral QHS PRN Patrecia Pour, NP   100 mg at 05/25/16 2131   PTA Medications: No prescriptions prior to admission.    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   Physician Treatment Plan for Primary Diagnosis: Schizoaffective disorder, bipolar type ([place]) Long Term Goal(s): Improvement in symptoms so as ready for discharge Improvement in symptoms so as ready for discharge   Short Term Goals: Ability to identify changes in lifestyle to reduce recurrence of condition will improve Ability to verbalize feelings will improve Ability to disclose and discuss suicidal ideas Ability to demonstrate self-control will improve Ability to identify and develop effective coping behaviors will improve Ability to maintain clinical measurements within normal limits will improve Compliance with prescribed medications will improve Ability to identify triggers associated with substance abuse/mental health issues will improve  Medication Management: Evaluate patient's response, side effects, and tolerance of medication regimen.  Therapeutic Interventions: 1 to 1 sessions, Unit Group sessions and Medication administration.  Evaluation of Outcomes: Met  Physician Treatment Plan for Secondary Diagnosis: Principal Problem:   Schizoaffective disorder, bipolar type ([place]) Active Problems:   Substance use disorder  Long Term Goal(s): Improvement in symptoms so as ready for discharge Improvement in symptoms so as ready for discharge   Short Term Goals: Ability to identify changes in lifestyle to reduce recurrence of condition will improve Ability to verbalize feelings will improve Ability to disclose and  discuss suicidal ideas Ability to demonstrate self-control will improve Ability to identify and develop effective coping behaviors will improve Ability to maintain clinical measurements within normal limits will improve Compliance with prescribed medications will improve Ability to identify triggers associated with substance abuse/mental health issues will improve     Medication Management: Evaluate patient's response, side effects, and tolerance of medication regimen.  Therapeutic Interventions: 1 to 1 sessions, Unit Group sessions and Medication administration.  Evaluation of Outcomes: Met  RN Treatment Plan for Primary Diagnosis: Schizoaffective disorder, bipolar type ([place]) Long Term Goal(s): Knowledge of disease and therapeutic regimen to maintain health will improve  Short Term Goals: Ability to remain free from injury will improve, Ability to identify and develop effective coping behaviors will improve and Compliance with prescribed medications will improve  Medication Management: RN will administer medications as ordered by provider, will assess and evaluate patient's response and provide education to patient for prescribed medication. RN will report any adverse and/or side effects to prescribing provider.  Therapeutic Interventions: 1 on 1 counseling sessions, Psychoeducation, Medication administration, Evaluate responses to treatment, Monitor vital signs and CBGs as ordered, Perform/monitor CIWA, COWS, AIMS and Fall Risk screenings as ordered, Perform wound care treatments as ordered.  Evaluation of Outcomes: Met  LCSW Treatment Plan for Primary Diagnosis: Schizoaffective disorder, bipolar type ([place]) Long Term Goal(s): Safe transition to appropriate next level of care at discharge, Engage patient in therapeutic group addressing interpersonal concerns.  Short Term Goals: Engage patient in aftercare planning with referrals and resources, Facilitate patient progression through stages  of change regarding substance use diagnoses and concerns and Identify triggers associated with mental health/substance abuse issues  Therapeutic Interventions: Assess for all discharge needs, 1 to 1 time with Social worker, Explore available resources and support systems, Assess for adequacy in community support network, Educate family and significant other(s) on suicide prevention, Complete Psychosocial Assessment, Interpersonal group therapy.  Evaluation of Outcomes: Met  Progress in Treatment: Attending groups: Yes. Participating in groups: Yes. Taking medication as prescribed: Yes. Toleration medication: Yes. Family/Significant other contact made: SPE completed with pt's fiance.  Patient understands diagnosis: Yes. Discussing patient identified problems/goals with staff: Yes. Medical problems stabilized or resolved: Yes. Denies suicidal/homicidal ideation: Yes. Issues/concerns per patient self-inventory: No. Other: n/a  New problem(s) identified: No, Describe:  n/a  New Short Term/Long Term Goal(s):  Detox; medication stabilization; development of comprehensive mental wellness/sobriety plan.   Discharge Plan or [place] home; [facility]up at [facility] mental [facility] associates for counseling.   Reason for Continuation of Hospitalization: medication management   Estimated Length of Stay: 1 day (tentative discharge scheduled for Saturday afternoon).   Attendees: Patient: 05/26/2016 11:02 AM  Physician: Dr. Sanjuana Letters MD 05/26/2016 11:02 AM  Nursing: Trinna Post; Kieth Brightly RN 05/26/2016 11:02 AM  RN Care Manager: Lars Pinks CM 05/26/2016 11:02 AM  Social Worker: Maxie Better, LCSW 05/26/2016 11:02 AM  Recreational Therapist: Rhunette Croft 05/26/2016 11:02 AM  Other: Lindell Spar NP 05/26/2016 11:02 AM  Other:  05/26/2016 11:02 AM  Other: 05/26/2016 11:02 AM    Scribe for Treatment Team: Kimber Relic Smart,  LCSW 05/26/2016 11:02 AM

## 2016-05-26 NOTE — Progress Notes (Signed)
Patient ID: [name], female   DOB: [birth year], 36 y.o.   MRN: [id number]  Pt currently presents with an anxious affect and behavior. Pt states "I am so tired of this, the thorazine isn't working for me." Pt reports good sleep with current medication regimen. Pt main complaint is current nausea/vomiting and constipation/diarrhea. Also complains of current discharge and discomfort in her vaginal area. Pt reports increased anxiety and AH/VH, states "I can't even look in that mirror right now."   Pt provided with medications per providers orders. Pt's labs and vitals were monitored throughout the night. Pt given a 1:1 about emotional and mental status. Pt supported and encouraged to express concerns and questions. Pt educated on medications and relaxation techniques. Responds to redirection well. Offered toiletries and assisted in setting up patient for a shower.   Pt's safety ensured with 15 minute and environmental checks. Pt currently denies HI. Pt verbally agrees to seek staff if SI/AVH worsens, HI occurs and to consult with staff before acting on any harmful thoughts. Will continue POC.

## 2016-05-26 NOTE — Progress Notes (Signed)
Patient ID: [name], female   DOB: 1980/12/13, 36 y.o.   MRN: [id number]  DAR: Pt. Denies HI. She reports passive SI but reports she is able to contract for safety. She also endorses auditory and visual hallucinations. She reports sleep is fair, appetite is fair, energy level is low, and concentration is poor. She rates depression, hopelessness, and anxiety 8-9/10. She denies withdrawal symptoms but appears anxious and agitated at times. Patient does report lower back pain and a headache intermittently and received PRN Tylenol. Support and encouragement provided to the patient throughout the day to speak with Clinical research associate with questions or concerns. She reports several times throughout the day that she is feeling anxious and speaks with MD Izediuno then receives a dose of Thorazine. Scheduled medications administered to patient per physician's orders. Patient requesting Thorazine and "something to calm me down" throughout the day. Patient's blood cultures came back and revealed positive GPC in clusters. Writer informed MD Izediuno. Writer spoke with MD Jackquline Berlin, [facility] Marchelle Folks, and MD Orvan Falconer from ID about best course of action. However, writer was notified that it is possible that cultures could have been contaminated by Microbiology dept Clois Dupes requested MD Izediuno speak with ID MD Orvan Falconer. Writer spoke with A/C Lanny Cramp and NP Tanika about possible contaminated specimen. Orders were received for redraw of blood cultures and scheduled Bactrim. On assessment by this writer and A/C Linsey, patient appears asymptomatic. Patient is seen in the milieu throughout most of the day, mainly seen sitting at the table in the dayroom coloring and writing. She is attending meals in the cafeteria off the unit without distress noted by staff. Q15 minute checks are maintained for safety.

## 2016-05-26 NOTE — Progress Notes (Signed)
PHARMACY - PHYSICIAN COMMUNICATION CRITICAL VALUE ALERT - BLOOD CULTURE IDENTIFICATION (BCID)  Results for orders placed or performed during the hospital encounter of 05/23/16  Blood Culture ID Panel (Reflexed) (Collected: 05/25/2016  6:25 PM)  Result Value Ref Range   Enterococcus species NOT DETECTED NOT DETECTED   Listeria monocytogenes NOT DETECTED NOT DETECTED   Staphylococcus species DETECTED (A) NOT DETECTED   Staphylococcus aureus NOT DETECTED NOT DETECTED   Methicillin resistance DETECTED (A) NOT DETECTED   Streptococcus species NOT DETECTED NOT DETECTED   Streptococcus agalactiae NOT DETECTED NOT DETECTED   Streptococcus pneumoniae NOT DETECTED NOT DETECTED   Streptococcus pyogenes NOT DETECTED NOT DETECTED   Acinetobacter baumannii NOT DETECTED NOT DETECTED   Enterobacteriaceae species NOT DETECTED NOT DETECTED   Enterobacter cloacae complex NOT DETECTED NOT DETECTED   Escherichia coli NOT DETECTED NOT DETECTED   Klebsiella oxytoca NOT DETECTED NOT DETECTED   Klebsiella pneumoniae NOT DETECTED NOT DETECTED   Proteus species NOT DETECTED NOT DETECTED   Serratia marcescens NOT DETECTED NOT DETECTED   Haemophilus influenzae NOT DETECTED NOT DETECTED   Neisseria meningitidis NOT DETECTED NOT DETECTED   Pseudomonas aeruginosa NOT DETECTED NOT DETECTED   Candida albicans NOT DETECTED NOT DETECTED   Candida glabrata NOT DETECTED NOT DETECTED   Candida krusei NOT DETECTED NOT DETECTED   Candida parapsilosis NOT DETECTED NOT DETECTED   Candida tropicalis NOT DETECTED NOT DETECTED    Name of physician (or Provider) Contacted: Dr. Jackquline Berlin  Changes to prescribed antibiotics required: no orders yet, recommend transfer for empiric IV abx if patient clinically ill since has risk factors and GPC in clusters grew in both sets   Clance Boll 05/26/2016  6:01 PM
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
## 2016-05-26 NOTE — Progress Notes (Addendum)
   05/26/16 1915  Vital Signs  Temp 98.2 F (36.8 C)  Temp Source Oral  Pulse Rate (!) 116  Pulse Rate Source Dinamap  Resp 20  BP 109/82  BP Location Left Arm  BP Method Automatic  Patient Position (if appropriate) Standing  Oxygen Therapy  SpO2 98 %  O2 Device Room Air   Patient presents to Clinical research associate as anxious, confused and tremulous. Pt reports recent episode of nausea/vomiting. It is documented patient has been sitting on the commode for 45 minutes. Pt reports constipation and stomach upset. Provider consulted about pt current condition and hematologic results from earlier today. Orders to send pt to [facility] ED for further evaluation and follow up on pharmacies suggested POC. See vitals in table above. Pt is alert and gait is steady. Pt currently sitting in hallway, wrapped in a blanket.

## 2016-05-26 NOTE — [facility] Group Notes (Signed)
[facility] LCSW Group Therapy  05/26/2016 2:33 PM  Type of Therapy:  Group Therapy  Participation Level:  Active  Participation Quality:  Attentive  Affect:  Appropriate  Cognitive:  Alert and Oriented  Insight:  Improving  Engagement in Therapy:  Improving  Modes of Intervention:  Confrontation, Discussion, Education, Problem-solving, Socialization and Support  Summary of Progress/Problems: Feelings around Relapse. Group members discussed the meaning of relapse and shared personal stories of relapse, how it affected them and others, and how they perceived themselves during this time. Group members were encouraged to identify triggers, warning signs and coping skills used when facing the possibility of relapse. Social supports were discussed and explored in detail. Post Acute Withdrawal Syndrome (handout provided) was introduced and examined. Pt's were encouraged to ask questions, talk about key points associated with PAWS, and process this information in terms of relapse prevention.   Ledell Peoples Smart LCSW 05/26/2016, 2:33 PM

## 2016-05-26 NOTE — ED Notes (Signed)
Pt reports last alcoholic drink was over 1 week ago.

## 2016-05-26 NOTE — ED Triage Notes (Signed)
Pt was sent to [facility] from [facility] for abnormal labs

## 2016-05-26 NOTE — Progress Notes (Signed)
Recreation Therapy Notes  Date: 05/26/16 Time: 0930 Location: 300 [address] Dayroom  Group Topic: Stress Management  Goal Area(s) Addresses:  Patient will verbalize importance of using healthy stress management.  Patient will identify positive emotions associated with healthy stress management.   Behavioral Response: Engaged  Intervention: Stress Management  Activity :  Self Forgiveness.  LRT introduced the stress management technique of meditation.  LRT played a meditation focused on self forgiveness allowing patients to participate in the technique.  Patients were to follow along as the meditation played to engage in the technique.  Education:  Stress Management, Discharge Planning.   Education Outcome: Acknowledges edcuation/In group clarification offered/Needs additional education  Clinical Observations/Feedback: Pt attended group.   [name], LRT/CTRS         [name] A 05/26/2016 11:46 AM
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
## 2016-05-26 NOTE — Progress Notes (Signed)
Patient ID: [name], female   DOB: [birth year], 36 y.o.   MRN: [id number]  Writer came back from lunch and was informed by nursing staff that patient reported hallucinations and feelings of high anxiety. Upon observation patient was sitting calmly in the dayroom speaking with a peer and writing something down. No distress noted by this writer upon observation and patient does not appear to be responding to internal stimuli.

## 2016-05-27 ENCOUNTER — Encounter (HOSPITAL_COMMUNITY): Payer: Self-pay | Admitting: Family Medicine

## 2016-05-27 ENCOUNTER — Observation Stay (HOSPITAL_COMMUNITY)
Admission: EM | Admit: 2016-05-27 | Discharge: 2016-05-29 | Disposition: A | Discharge status: Other Acute Inpatient Hospital | Payer: Self-pay | Source: Other Acute Inpatient Hospital | Attending: Internal Medicine | Admitting: Internal Medicine

## 2016-05-27 ENCOUNTER — Emergency Department (HOSPITAL_COMMUNITY)
Admission: EM | Admit: 2016-05-27 | Discharge: 2016-05-27 | Disposition: A | Discharge status: Still In Hospital | Payer: Self-pay | Attending: Family Medicine | Admitting: Family Medicine

## 2016-05-27 DIAGNOSIS — Z6841 Body Mass Index (BMI) 40.0 and over, adult: Secondary | ICD-10-CM | POA: Insufficient documentation

## 2016-05-27 DIAGNOSIS — R45851 Suicidal ideations: Secondary | ICD-10-CM | POA: Insufficient documentation

## 2016-05-27 DIAGNOSIS — F1290 Cannabis use, unspecified, uncomplicated: Secondary | ICD-10-CM | POA: Insufficient documentation

## 2016-05-27 DIAGNOSIS — F1010 Alcohol abuse, uncomplicated: Secondary | ICD-10-CM | POA: Insufficient documentation

## 2016-05-27 DIAGNOSIS — F3289 Other specified depressive episodes: Secondary | ICD-10-CM | POA: Insufficient documentation

## 2016-05-27 DIAGNOSIS — F1210 Cannabis abuse, uncomplicated: Secondary | ICD-10-CM

## 2016-05-27 DIAGNOSIS — F4310 Post-traumatic stress disorder, unspecified: Secondary | ICD-10-CM | POA: Insufficient documentation

## 2016-05-27 DIAGNOSIS — J45909 Unspecified asthma, uncomplicated: Secondary | ICD-10-CM | POA: Insufficient documentation

## 2016-05-27 DIAGNOSIS — R7881 Bacteremia: Secondary | ICD-10-CM | POA: Diagnosis present

## 2016-05-27 DIAGNOSIS — Z79899 Other long term (current) drug therapy: Secondary | ICD-10-CM

## 2016-05-27 DIAGNOSIS — F17210 Nicotine dependence, cigarettes, uncomplicated: Secondary | ICD-10-CM | POA: Insufficient documentation

## 2016-05-27 DIAGNOSIS — I959 Hypotension, unspecified: Secondary | ICD-10-CM | POA: Insufficient documentation

## 2016-05-27 DIAGNOSIS — F1990 Other psychoactive substance use, unspecified, uncomplicated: Secondary | ICD-10-CM | POA: Diagnosis present

## 2016-05-27 DIAGNOSIS — F419 Anxiety disorder, unspecified: Secondary | ICD-10-CM | POA: Insufficient documentation

## 2016-05-27 DIAGNOSIS — F1910 Other psychoactive substance abuse, uncomplicated: Secondary | ICD-10-CM | POA: Insufficient documentation

## 2016-05-27 DIAGNOSIS — F250 Schizoaffective disorder, bipolar type: Principal | ICD-10-CM | POA: Insufficient documentation

## 2016-05-27 MED ORDER — LORAZEPAM 0.5 MG PO TABS: 0.5000 mg | ORAL_TABLET | Freq: Two times a day (BID) | ORAL | Status: DC

## 2016-05-27 MED ORDER — CHLORPROMAZINE HCL 100 MG PO TABS
200.0000 mg | ORAL_TABLET | Freq: Every day | ORAL | Status: DC
  Administered 2016-05-28: 200 mg via ORAL
  Filled 2016-05-27 (×2): qty 2

## 2016-05-27 MED ORDER — MAGNESIUM HYDROXIDE 400 MG/5ML PO SUSP: 15.0000 mL | Freq: Every day | ORAL | Status: DC | PRN

## 2016-05-27 MED ORDER — CHLORPROMAZINE HCL 25 MG PO TABS
25.0000 mg | ORAL_TABLET | Freq: Two times a day (BID) | ORAL | Status: DC | PRN
  Filled 2016-05-27: qty 1

## 2016-05-27 MED ORDER — SERTRALINE HCL 100 MG PO TABS: 100.0000 mg | ORAL_TABLET | Freq: Every day | ORAL | Status: DC

## 2016-05-27 MED ORDER — LORAZEPAM 0.5 MG PO TABS
0.5000 mg | ORAL_TABLET | Freq: Two times a day (BID) | ORAL | 0 refills | Status: DC
Start: 2016-05-27 — End: 2016-05-30

## 2016-05-27 MED ORDER — CHLORPROMAZINE HCL 25 MG PO TABS: 200.0000 mg | ORAL_TABLET | Freq: Every day | ORAL | Status: DC

## 2016-05-27 MED ORDER — LORAZEPAM 1 MG PO TABS
1.0000 mg | ORAL_TABLET | Freq: Once | ORAL | Status: DC
  Filled 2016-05-27: qty 1

## 2016-05-27 MED ORDER — CHLORPROMAZINE HCL 100 MG PO TABS: 200.0000 mg | ORAL_TABLET | Freq: Every day | ORAL | Status: DC

## 2016-05-27 MED ORDER — CHLORPROMAZINE HCL 25 MG PO TABS
25.0000 mg | ORAL_TABLET | Freq: Two times a day (BID) | ORAL | Status: DC | PRN
  Administered 2016-05-27 – 2016-05-29 (×5): 25 mg via ORAL
  Filled 2016-05-27 (×8): qty 1

## 2016-05-27 MED ORDER — TRAZODONE HCL 100 MG PO TABS: 100.0000 mg | ORAL_TABLET | Freq: Every evening | ORAL | Status: DC | PRN

## 2016-05-27 MED ORDER — NICOTINE 21 MG/24HR TD PT24
21.0000 mg | MEDICATED_PATCH | Freq: Every day | TRANSDERMAL | Status: DC
  Administered 2016-05-27 – 2016-05-29 (×3): 21 mg via TRANSDERMAL
  Filled 2016-05-27 (×3): qty 1

## 2016-05-27 MED ORDER — BUSPIRONE HCL 5 MG PO TABS: 5.0000 mg | ORAL_TABLET | Freq: Three times a day (TID) | ORAL | Status: DC

## 2016-05-27 MED ORDER — TRAZODONE HCL 50 MG PO TABS: 100.0000 mg | ORAL_TABLET | Freq: Every evening | ORAL | Status: DC | PRN

## 2016-05-27 MED ORDER — NICOTINE 21 MG/24HR TD PT24: 21.0000 mg | MEDICATED_PATCH | Freq: Every day | TRANSDERMAL | 0 refills | Status: DC

## 2016-05-27 MED ORDER — HALOPERIDOL 5 MG PO TABS
5.0000 mg | ORAL_TABLET | Freq: Once | ORAL | Status: AC
  Administered 2016-05-27: 5 mg via ORAL
  Filled 2016-05-27: qty 1

## 2016-05-27 MED ORDER — LORAZEPAM 0.5 MG PO TABS
0.5000 mg | ORAL_TABLET | Freq: Two times a day (BID) | ORAL | Status: DC
  Administered 2016-05-27 – 2016-05-29 (×4): 0.5 mg via ORAL
  Filled 2016-05-27 (×4): qty 1

## 2016-05-27 MED ORDER — TRAZODONE HCL 50 MG PO TABS
100.0000 mg | ORAL_TABLET | Freq: Every evening | ORAL | Status: DC | PRN
  Administered 2016-05-28: 22:00:00 100 mg via ORAL
  Filled 2016-05-27: qty 2

## 2016-05-27 MED ORDER — ZOLPIDEM TARTRATE 5 MG PO TABS: 5.0000 mg | ORAL_TABLET | Freq: Every evening | ORAL | Status: DC | PRN

## 2016-05-27 MED ORDER — THIAMINE HCL 100 MG PO TABS: 100.0000 mg | ORAL_TABLET | Freq: Every day | ORAL | Status: DC

## 2016-05-27 MED ORDER — SERTRALINE HCL 50 MG PO TABS: 100.0000 mg | ORAL_TABLET | Freq: Every day | ORAL | Status: DC

## 2016-05-27 MED ORDER — ADULT MULTIVITAMIN W/MINERALS CH
1.0000 | ORAL_TABLET | Freq: Every day | ORAL | Status: DC
  Administered 2016-05-27 – 2016-05-29 (×3): 1 via ORAL
  Filled 2016-05-27 (×3): qty 1

## 2016-05-27 MED ORDER — ACETAMINOPHEN 325 MG PO TABS
650.0000 mg | ORAL_TABLET | Freq: Four times a day (QID) | ORAL | Status: DC | PRN
  Administered 2016-05-27 – 2016-05-28 (×2): 650 mg via ORAL
  Filled 2016-05-27 (×2): qty 2

## 2016-05-27 MED ORDER — LINEZOLID 600 MG PO TABS: 600.0000 mg | ORAL_TABLET | Freq: Two times a day (BID) | ORAL | Status: DC

## 2016-05-27 MED ORDER — VITAMIN B-1 100 MG PO TABS: 100.0000 mg | ORAL_TABLET | Freq: Every day | ORAL | Status: DC

## 2016-05-27 MED ORDER — CHLORPROMAZINE HCL 25 MG PO TABS: 25.0000 mg | ORAL_TABLET | Freq: Two times a day (BID) | ORAL | Status: DC | PRN

## 2016-05-27 MED ORDER — CHLORPROMAZINE HCL 25 MG PO TABS
25.0000 mg | ORAL_TABLET | Freq: Two times a day (BID) | ORAL | Status: DC | PRN
Start: 2016-05-27 — End: 2016-05-30

## 2016-05-27 MED ORDER — VANCOMYCIN HCL IN DEXTROSE 1-5 GM/200ML-% IV SOLN
1000.0000 mg | Freq: Once | INTRAVENOUS | Status: DC
  Filled 2016-05-27: qty 200

## 2016-05-27 MED ORDER — ADULT MULTIVITAMIN W/MINERALS CH: 1.0000 | ORAL_TABLET | Freq: Every day | ORAL | Status: DC

## 2016-05-27 MED ORDER — LINEZOLID 600 MG PO TABS
600.0000 mg | ORAL_TABLET | Freq: Two times a day (BID) | ORAL | Status: DC
  Administered 2016-05-27: 11:00:00 600 mg via ORAL
  Filled 2016-05-27: qty 1

## 2016-05-27 MED ORDER — LINEZOLID 600 MG PO TABS
600.0000 mg | ORAL_TABLET | Freq: Two times a day (BID) | ORAL | Status: DC
  Administered 2016-05-27: 600 mg via ORAL
  Filled 2016-05-27 (×2): qty 1

## 2016-05-27 MED ORDER — NICOTINE 21 MG/24HR TD PT24: 21.0000 mg | MEDICATED_PATCH | Freq: Every day | TRANSDERMAL | Status: DC

## 2016-05-27 MED ORDER — BUSPIRONE HCL 5 MG PO TABS
5.0000 mg | ORAL_TABLET | Freq: Three times a day (TID) | ORAL | Status: DC
  Administered 2016-05-27 – 2016-05-29 (×7): 5 mg via ORAL
  Filled 2016-05-27 (×7): qty 1

## 2016-05-27 MED ORDER — VITAMIN B-1 100 MG PO TABS
100.0000 mg | ORAL_TABLET | Freq: Every day | ORAL | Status: DC
  Administered 2016-05-27 – 2016-05-29 (×3): 100 mg via ORAL
  Filled 2016-05-27 (×3): qty 1

## 2016-05-27 MED ORDER — LORAZEPAM 0.5 MG PO TABS
0.5000 mg | ORAL_TABLET | ORAL | Status: DC | PRN
  Administered 2016-05-27 – 2016-05-29 (×3): 1 mg via ORAL
  Filled 2016-05-27 (×3): qty 2

## 2016-05-27 MED ORDER — ACETAMINOPHEN 325 MG PO TABS: 650.0000 mg | ORAL_TABLET | Freq: Four times a day (QID) | ORAL | Status: DC | PRN

## 2016-05-27 MED ORDER — CHLORPROMAZINE HCL 200 MG PO TABS: 200.0000 mg | ORAL_TABLET | Freq: Every day | ORAL | Status: DC

## 2016-05-27 MED ORDER — SERTRALINE HCL 100 MG PO TABS
100.0000 mg | ORAL_TABLET | Freq: Every day | ORAL | Status: DC
  Administered 2016-05-27 – 2016-05-29 (×3): 100 mg via ORAL
  Filled 2016-05-27 (×3): qty 1

## 2016-05-27 NOTE — Progress Notes (Signed)
Patient arrived on the unit at approximately 0423 accompanied by sitter. She is alert and verbally responsive but has a flat affect. Suicide precautions in place. On contact isolation for positive blood culture for MRSA.

## 2016-05-27 NOTE — ED Notes (Signed)
Bed: WA04 Expected date:  Expected time:  Means of arrival:  Comments: 

## 2016-05-27 NOTE — Progress Notes (Addendum)
CSW spoke with [facility] at [facility] regarding transfer. At this time, [facility] does not have any bed available. CSW provided [facility] with MRN # and [facility] will look at placement tomorrow 4/01. CSW will update CSW to follow up.   Stacy Gardner, LCSWA Clinical Social Worker [phone]
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
## 2016-05-27 NOTE — Progress Notes (Signed)
CSW received call that patient is medically cleared at this time and will need to be transported back to [facility]. CSW will follow up with [facility].   Stacy Gardner, LCSWA Clinical Social Worker [phone]

## 2016-05-27 NOTE — Progress Notes (Signed)
Pt informed no bed available at [facility] at this time & probably will get transferred tomorrow. Pt accepted this news but did express mild disappointment. Synia Douglass, [employer]
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
## 2016-05-27 NOTE — Progress Notes (Addendum)
  Call from ED RN reveals that patient will be admitted IP at [facility]. This Clinical research associate will send belongings with security to pt room when admitted.   05/27/16  1:45 AM Will continue to hold pt personal items at [facility] until follow up received from ED RN, Victorino Dike.

## 2016-05-27 NOTE — Consult Note (Signed)
[facility]    Date of Admission:  05/27/2016           Day 1 linezolid       Reason for Consult: Positive blood cultures    Referring Physician: Dr. Neila Gear  Principal Problem:   Positive blood cultures Active Problems:   Schizoaffective disorder, bipolar type (HCC)   Substance use disorder   Alcohol abuse   Cigarette smoker   Morbid obesity (HCC)   Anxiety   . busPIRone  5 mg Oral TID  . chlorproMAZINE  200 mg Oral QHS  . linezolid  600 mg Oral Q12H  . LORazepam  0.5 mg Oral BID  . multivitamin with minerals  1 tablet Oral Daily  . nicotine  21 mg Transdermal Daily  . sertraline  100 mg Oral Daily  . thiamine  100 mg Oral Daily    Recommendations: 1. Discontinue linezolid 2. Transfer back to the [facility]   Assessment: Her positive blood cultures are likely to both represent in significant contaminants. One set has grown staph epidermidis and the other has grown staph hominis. I suspect that her initial tachycardia was related to her anxiety rather than sepsis. She does not have a history of injecting drug use. She has been afebrile with a normal white blood cell count. She has no clinical illness to suggest true bacteremia. I will stop linezolid and recommend transfer back to [facility].     HPI: [name] is a 36 y.o. female with a long history of schizoaffective disorder, depression, and anxiety who began to develop hallucinations and suicidal indications leading to her coming to the emergency department on 05/22/2016. She was admitted to [facility]. She was noted to be tachycardic with a heart rate of 131 prompting blood cultures to be obtained. Yesterday both sets of blood cultures were reported to be growing methicillin-resistant coagulase-negative staph. I was called yesterday and informed of this result and told that she was an intravenous drug user (she vehemently  denies this and I cannot see it documented anywhere in her records). She was transferred here to [facility] last night for further evaluation and treatment.   Review of Systems: Review of Systems  Constitutional: Negative for chills, diaphoresis, fever, malaise/fatigue and weight loss.  HENT: Negative for sore throat.   Respiratory: Negative for cough, sputum production and shortness of breath.   Cardiovascular: Negative for chest pain.  Gastrointestinal: Negative for abdominal pain, diarrhea, heartburn, nausea and vomiting.  Genitourinary: Negative for dysuria and frequency.  Musculoskeletal: Negative for joint pain and myalgias.  Skin: Negative for rash.  Neurological: Negative for dizziness and headaches.  Psychiatric/Behavioral: Positive for depression, hallucinations and suicidal ideas. Negative for substance abuse. The patient is nervous/anxious.        She admits to smoking marijuana but denies any other illicit drug use. Her urine drug screen was positive for THC.    Past Medical History:  Diagnosis Date  . Anemia   . Anxiety   . Asthma   . Depression   . Headache(784.0)   . Pneumonia   . Substance use disorder 05/24/2016    Social History  Substance Use Topics  . Smoking status: Current Every Day Smoker    Packs/day: 0.50  . Smokeless tobacco: Never Used  . Alcohol use 10.2 oz/week    12 Cans of beer, 2 Glasses of wine, 3 Shots of liquor per week  History reviewed. No pertinent family history. No Known Allergies  OBJECTIVE: Blood pressure 123/71, pulse 84, temperature 97.8 F (36.6 C), temperature source Oral, resp. rate 14, height  (1.626 m), weight 235 lb (106.6 kg), SpO2 99 %.  Physical Exam  Constitutional: She is oriented to person, place, and time.  She is alert and in no distress. She is in good spirits sitting up in bed.  HENT:  Mouth/Throat: No oropharyngeal exudate.  No conjunctival hemorrhages.  Eyes: Conjunctivae are normal.    Cardiovascular: Normal rate and regular rhythm.   No murmur heard. Pulmonary/Chest: Effort normal and breath sounds normal. She has no wheezes. She has no rales.  Abdominal: Soft. She exhibits no mass. There is no tenderness.  Musculoskeletal: Normal range of motion.  Neurological: She is alert and oriented to person, place, and time.  Skin: No rash noted.  No splinter hemorrhages.  Psychiatric: Mood and affect normal.    Lab Results Lab Results  Component Value Date   WBC 10.1 05/26/2016   HGB 13.4 05/26/2016   HCT 39.0 05/26/2016   MCV 91.1 05/26/2016   PLT 272 05/26/2016    Lab Results  Component Value Date   CREATININE 0.71 05/26/2016   BUN 11 05/26/2016   NA 138 05/26/2016   K 4.0 05/26/2016   CL 105 05/26/2016   CO2 26 05/26/2016    Lab Results  Component Value Date   ALT 14 05/26/2016   AST 16 05/26/2016   ALKPHOS 67 05/26/2016   BILITOT 0.3 05/26/2016     Microbiology: Recent Results (from the past 240 hour(s))  Culture, Urine     Status: Abnormal   Collection Time: 05/24/16  6:26 AM  Result Value Ref Range Status   Specimen Description   Final    URINE, CLEAN CATCH Performed at [facility], [address] [place], [place] [address]    Special Requests   Final    NONE Performed at [facility], [address] [place], [place] [address]    Culture MULTIPLE SPECIES PRESENT, SUGGEST RECOLLECTION (A)  Final   Report Status 05/26/2016 FINAL  Final  Culture, blood (Routine X 2) w Reflex to ID Panel     Status: Abnormal (Preliminary result)   Collection Time: 05/25/16  6:25 PM  Result Value Ref Range Status   Specimen Description   Final    BLOOD LEFT ARM Performed at [facility], [address] [place], [place] [address]    Special Requests   Final    IN PEDIATRIC BOTTLE 3CC Performed at [facility], [address], [place], [place] [address]    Culture  Setup Time    Final    GRAM POSITIVE COCCI IN CLUSTERS IN PEDIATRIC BOTTLE CRITICAL RESULT CALLED TO, READ BACK BY AND VERIFIED WITH: Acquanetta Chain RN 16:10 05/26/16 ([email or web address]) Performed at [facility], [address], [place], [place] [address]    Culture STAPHYLOCOCCUS HOMINIS (A)  Final   Report Status PENDING  Incomplete  Blood Culture ID Panel (Reflexed)     Status: Abnormal   Collection Time: 05/25/16  6:25 PM  Result Value Ref Range Status   Enterococcus species NOT DETECTED NOT DETECTED Final   Listeria monocytogenes NOT DETECTED NOT DETECTED Final   Staphylococcus species DETECTED (A) NOT DETECTED Final    Comment: Methicillin (oxacillin) resistant coagulase negative staphylococcus. Possible blood culture contaminant (unless isolated from more than one blood culture draw or  clinical case suggests pathogenicity). No antibiotic treatment is indicated for blood  culture contaminants. CRITICAL RESULT CALLED TO, READ BACK BY AND VERIFIED WITH: Acquanetta Chain RN 16:10 05/26/16 ([email or web address])    Staphylococcus aureus NOT DETECTED NOT DETECTED Final   Methicillin resistance DETECTED (A) NOT DETECTED Final    Comment: CRITICAL RESULT CALLED TO, READ BACK BY AND VERIFIED WITH: Acquanetta Chain RN 16:10 05/26/16 ([email or web address])    Streptococcus species NOT DETECTED NOT DETECTED Final   Streptococcus agalactiae NOT DETECTED NOT DETECTED Final   Streptococcus pneumoniae NOT DETECTED NOT DETECTED Final   Streptococcus pyogenes NOT DETECTED NOT DETECTED Final   Acinetobacter baumannii NOT DETECTED NOT DETECTED Final   Enterobacteriaceae species NOT DETECTED NOT DETECTED Final   Enterobacter cloacae complex NOT DETECTED NOT DETECTED Final   Escherichia coli NOT DETECTED NOT DETECTED Final   Klebsiella oxytoca NOT DETECTED NOT DETECTED Final   Klebsiella pneumoniae NOT DETECTED NOT DETECTED Final   Proteus species NOT DETECTED NOT DETECTED Final   Serratia marcescens NOT DETECTED NOT DETECTED Final   Haemophilus influenzae  NOT DETECTED NOT DETECTED Final   Neisseria meningitidis NOT DETECTED NOT DETECTED Final   Pseudomonas aeruginosa NOT DETECTED NOT DETECTED Final   Candida albicans NOT DETECTED NOT DETECTED Final   Candida glabrata NOT DETECTED NOT DETECTED Final   Candida krusei NOT DETECTED NOT DETECTED Final   Candida parapsilosis NOT DETECTED NOT DETECTED Final   Candida tropicalis NOT DETECTED NOT DETECTED Final    Comment: Performed at [facility], [address] [place], [place] [address]  Culture, blood (Routine X 2) w Reflex to ID Panel     Status: Abnormal (Preliminary result)   Collection Time: 05/25/16  6:33 PM  Result Value Ref Range Status   Specimen Description   Final    BLOOD RIGHT ARM Performed at [facility], [address], [place], [place] [address]    Special Requests   Final    IN PEDIATRIC BOTTLE .5CC Performed at [facility], [address] [place], [place] [address]    Culture  Setup Time   Final    GRAM POSITIVE COCCI IN CLUSTERS AEROBIC BOTTLE ONLY CRITICAL VALUE NOTED.  VALUE IS CONSISTENT WITH PREVIOUSLY REPORTED AND CALLED VALUE. Performed at [facility], [address] [place], [place] [address]    Culture STAPHYLOCOCCUS EPIDERMIDIS (A)  Final   Report Status PENDING  Incomplete    Cliffton Asters, MD [facility] [phone] pager   [phone] cell 05/27/2016, 11:40 AM

## 2016-05-27 NOTE — Progress Notes (Signed)
Spoke with Psych NP Erlene Quan by phone re: suicidality of patient.  Stated that the patient was no longer considered a suicide risk per their notes.  Patient to me states that she is not a suicide risk.  No sitter ordered.

## 2016-05-27 NOTE — Progress Notes (Signed)
[facility] PROGRESS NOTE  Patient: [name] UJW:[id number]   PCP: No PCP Per Patient DOB: 29-Sep-1980   DOA: 05/27/2016   DOS: 05/27/2016    Subjective: pt wanted to go 'home', demanding to be seen by psychiatrist and mentioned that she has a plan for after care.  Objective:  Vitals:   05/27/16 1413 05/27/16 2120  BP: 133/77 119/60  Pulse: (!) 102 (!) 101  Resp: 15 18  Temp: 97.6 F (36.4 C) 97.8 F (36.6 C)    Pt agitated and cursing.  Assessment and plan: threatening to leave AMA Suicidal ideation and hallucination Discuss with psychiatry on call, recommends IVC paper work. Pt will be seen by psych tomorrow.  Time spent 60 minutes in coordinating care with psychiatry, house coverage, pt's nurse.  Author: Lynden Oxford, MD [facility] Pager: [phone] 05/27/2016 7:59 PM   If 7PM-7AM, please contact night-coverage at [email or web address], password [facility]

## 2016-05-27 NOTE — ED Provider Notes (Signed)
[facility]-EMERGENCY DEPT Provider Note   CSN: [id number] Arrival date & time: 05/26/16  2138     History   Chief Complaint Chief Complaint  Patient presents with  . Abnormal Lab    HPI [name] is a 36 y.o. female.  HPI Pt comes in with cc of + cultures from behavioral health. Pt has remote hx of ivda, she currently doesn't use iv drugs. Pt has behavioral health issues and at [facility] currently. Blood cultures were ordered as she had tachycardia - there is no specific note discussing infection. Pt denies nausea, emesis, fevers, chills, chest pains, shortness of breath, headaches, abdominal pain, uti like symptoms. + rash in groin. Pt has no hx of PE, DVT and denies any exogenous hormone (testosterone / estrogen) use, long distance travels or surgery in the past 6 weeks, active cancer, recent immobilization.   Past Medical History:  Diagnosis Date  . Anemia   . Anxiety   . Asthma   . Depression   . Headache(784.0)   . Pneumonia   . Substance use disorder 05/24/2016    Patient Active Problem List   Diagnosis Date Noted  . Positive blood cultures 05/27/2016  . Alcohol abuse 05/27/2016  . Cigarette smoker 05/27/2016  . Morbid obesity (HCC) 05/27/2016  . Anxiety 05/27/2016  . Substance use disorder 05/24/2016  . Schizoaffective disorder, bipolar type (HCC) 05/31/2011    Past Surgical History:  Procedure Laterality Date  . CESAREAN SECTION  2006  . THYROID SURGERY  2003    OB History    No data available       Home Medications    Prior to Admission medications   Medication Sig Start Date End Date Taking? Authorizing Provider  busPIRone (BUSPAR) 5 MG tablet Take 1 tablet (5 mg total) by mouth 3 (three) times daily. 05/27/16   Rolly Salter, MD  chlorproMAZINE (THORAZINE) 200 MG tablet Take 1 tablet (200 mg total) by mouth at bedtime. 05/27/16   Rolly Salter, MD  chlorproMAZINE (THORAZINE) 25 MG tablet Take 1 tablet (25 mg total) by mouth 2 (two) times daily as  needed (Agitation). 05/27/16   Rolly Salter, MD  LORazepam (ATIVAN) 0.5 MG tablet Take 1 tablet (0.5 mg total) by mouth 2 (two) times daily. 05/27/16   Rolly Salter, MD  nicotine (NICODERM CQ - DOSED IN MG/24 HOURS) 21 mg/24hr patch Place 1 patch (21 mg total) onto the skin daily. 05/28/16   Rolly Salter, MD  sertraline (ZOLOFT) 100 MG tablet Take 1 tablet (100 mg total) by mouth daily. 05/28/16   Rolly Salter, MD  thiamine 100 MG tablet Take 1 tablet (100 mg total) by mouth daily. 05/28/16   Rolly Salter, MD  traZODone (DESYREL) 100 MG tablet Take 1 tablet (100 mg total) by mouth at bedtime as needed for sleep. 05/27/16   Rolly Salter, MD  traZODone (DESYREL) 150 MG tablet Take 1 tablet (150 mg total) by mouth at bedtime. For insomnia. 06/07/11 07/07/11  Mike Craze, MD  traZODone (DESYREL) 150 MG tablet Take 1 tablet (150 mg total) by mouth at bedtime. 06/26/11 07/26/11  Gwyneth Sprout, MD    Family History History reviewed. No pertinent family history.  Social History Social History  Substance Use Topics  . Smoking status: Current Every Day Smoker    Packs/day: 0.50  . Smokeless tobacco: Never Used  . Alcohol use 10.2 oz/week    12 Cans of beer, 2 Glasses of wine, 3  Shots of liquor per week     Allergies   Patient has no known allergies.   Review of Systems Review of Systems  ROS 10 Systems reviewed and are negative for acute change except as noted in the HPI.     Physical Exam Updated Vital Signs BP 117/65 (BP Location: Left Arm)   Pulse (!) 107   Temp 98.5 F (36.9 C) (Oral)   Resp 16   Ht 5' 5.25" (1.657 m)   Wt 219 lb (99.3 kg)   SpO2 98%   BMI 36.16 kg/m   Physical Exam  Constitutional: She is oriented to person, place, and time. She appears well-developed and well-nourished.  HENT:  Head: Normocephalic and atraumatic.  Eyes: EOM are normal. Pupils are equal, round, and reactive to light.  Neck: Neck supple.  Cardiovascular: Normal rate, regular  rhythm and normal heart sounds.   No murmur heard. Pulmonary/Chest: Effort normal. No respiratory distress.  Abdominal: Soft. She exhibits no distension. There is no tenderness. There is no rebound and no guarding.  Neurological: She is alert and oriented to person, place, and time.  Skin: Skin is warm and dry. Rash noted.  Erythematous rash in the groin  Nursing note and vitals reviewed.    ED Treatments / Results  Labs (all labs ordered are listed, but only abnormal results are displayed) Labs Reviewed  URINE CULTURE - Abnormal; Notable for the following:       Result Value   Culture MULTIPLE SPECIES PRESENT, SUGGEST RECOLLECTION (*)    All other components within normal limits  CULTURE, BLOOD (ROUTINE X 2) - Abnormal; Notable for the following:    Culture   (*)    Value: STAPHYLOCOCCUS HOMINIS SUSCEPTIBILITIES TO FOLLOW Performed at [facility], [address], [place], [place] [address]    All other components within normal limits  CULTURE, BLOOD (ROUTINE X 2) - Abnormal; Notable for the following:    Culture STAPHYLOCOCCUS EPIDERMIDIS (*)    All other components within normal limits  BLOOD CULTURE ID PANEL (REFLEXED) - Abnormal; Notable for the following:    Staphylococcus species DETECTED (*)    Methicillin resistance DETECTED (*)    All other components within normal limits  COMPREHENSIVE METABOLIC PANEL - Abnormal; Notable for the following:    Calcium 8.7 (*)    All other components within normal limits  SEDIMENTATION RATE - Abnormal; Notable for the following:    Sed Rate 34 (*)    All other components within normal limits  CBC WITH DIFFERENTIAL/PLATELET - Abnormal; Notable for the following:    WBC 14.1 (*)    Neutro Abs 9.9 (*)    All other components within normal limits  COMPREHENSIVE METABOLIC PANEL - Abnormal; Notable for the following:    Sodium 134 (*)    Chloride 100 (*)    Glucose, Bld 161 (*)    Total Bilirubin 0.2 (*)    All other  components within normal limits  CULTURE, BLOOD (ROUTINE X 2)  CULTURE, BLOOD (ROUTINE X 2)  CBC WITH DIFFERENTIAL/PLATELET  SEDIMENTATION RATE  GC/CHLAMYDIA PROBE AMP ([facility]) NOT AT [facility]    EKG  EKG Interpretation None       Radiology No results found.  Procedures Procedures (including critical care time)  Medications Ordered in ED Medications  hydrOXYzine (ATARAX/VISTARIL) tablet 25 mg (25 mg Oral Given 05/26/16 1657)  loperamide (IMODIUM) capsule 2-4 mg (not administered)  ondansetron (ZOFRAN-ODT) disintegrating tablet 4 mg (not administered)  Influenza vac split quadrivalent PF (FLUARIX) injection 0.5 mL (0.5 mLs Intramuscular Given 05/24/16 1423)  pneumococcal 23 valent vaccine (PNU-IMMUNE) injection 0.5 mL (0.5 mLs Intramuscular Given 05/24/16 1421)  LORazepam (ATIVAN) tablet 1 mg (1 mg Oral Given 05/25/16 0754)  chlorproMAZINE (THORAZINE) tablet 25 mg (25 mg Oral Given 05/26/16 1135)  sertraline (ZOLOFT) tablet 25 mg (25 mg Oral Given 05/26/16 1135)  chlorproMAZINE (THORAZINE) tablet 25 mg (25 mg Oral Given 05/26/16 1417)     Initial Impression / Assessment and Plan / ED Course  I have reviewed the triage vital signs and the nursing notes.  Pertinent labs & imaging results that were available during my care of the patient were reviewed by me and considered in my medical decision making (see chart for details).    Pt comes in with cc of + blood cultures. PT was at [facility]. It is not clear to [place] why, but blood cultures were drawn at [facility] (tachycardia?). Pt has denies fevers. Blood cultures were + for staph, and so pt sent to the ER. Repeat blood cultures done at [facility].  Pt denies IV drug use. Pt is not immunocompromised or immunosuppressed. Pt denies nausea, emesis, fevers, chills, chest pains, shortness of breath, headaches, abdominal pain, uti like symptoms.  Pt has a rash in her groin area.  I have consulted medicine. I am not sure if the patient has contaminant showing  up on the cultures or has true bacteremia -since she has no systemic symptoms at all. Medicine consulted.    Final Clinical Impressions(s) / ED Diagnoses   Final diagnoses:  Bacteremia    New Prescriptions There are no discharge medications for this patient.    Derwood Kaplan, MD 05/28/16 1729
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
## 2016-05-28 LAB — CULTURE, BLOOD (ROUTINE X 2)

## 2016-05-28 MED ORDER — HALOPERIDOL LACTATE 5 MG/ML IJ SOLN: 2.0000 mg | Freq: Four times a day (QID) | INTRAMUSCULAR | Status: DC | PRN

## 2016-05-28 MED ORDER — HALOPERIDOL 2 MG PO TABS
2.0000 mg | ORAL_TABLET | Freq: Four times a day (QID) | ORAL | Status: DC | PRN
  Administered 2016-05-28 (×2): 2 mg via ORAL
  Filled 2016-05-28 (×4): qty 1

## 2016-05-28 NOTE — Progress Notes (Signed)
LCSW following for disposition:  [facility] return once bed available. Spoke with [facility] with regards to admission for patient. No beds available today at [facility] due to possible +flu for patient and maintence needed on other room, thus no rooms available for Sunday.  Will follow up with [facility] on Monday for possible re-admission.  Deretha Emory, MSW Clinical Social Work: Optician, dispensing Coverage for :  [phone]
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
## 2016-05-28 NOTE — Plan of Care (Signed)
Problem: Safety: Goal: Ability to remain free from injury will improve Outcome: Progressing Maintain sitter until discontinued

## 2016-05-28 NOTE — Progress Notes (Signed)
Patient was very agitated and demanded that she have something for her hallucinations. MD on call ordered Ativan 1 mg po but patient refused it. She stated " Ativan does nothing for me. I need medication for my hallucinations" MD on call was made aware and order was changed to Haldol 5 mg po x 1. Patient eventually went to sleep. No further complaints voiced except a request for a dose of Thorazine. Will continue to monitor for further changes. Sitter at bedside.

## 2016-05-28 NOTE — Progress Notes (Signed)
[facility] PROGRESS NOTE  Patient: [name] ZOX:[id number]   PCP: No PCP Per Patient DOB: 1980-09-19   DOA: 05/27/2016   DOS: 05/28/2016    Subjective: Feeling better, less anxious this morning. No hallucination per patient.  Objective:  Vitals:   05/27/16 2120 05/28/16 0532  BP: 119/60   Pulse: (!) 101 100  Resp: 18 17  Temp: 97.8 F (36.6 C) 98.1 F (36.7 C)    Assessment and plan: Remain stable to be discharged to behavioral health. Already discussed with behavioral health psychiatry yesterday for second opinion. Awaiting their recommendation as well as eventual transfer to behavioral health.   Author: Lynden Oxford, MD [facility] Pager: [phone] 05/28/2016 9:38 AM   If 7PM-7AM, please contact night-coverage at [email or web address], password [facility]

## 2016-05-28 NOTE — Care Management Note (Signed)
Case Management Note  Patient Details  Name: [name] MRN: [id number] Date of Birth: 05/26/1980  Subjective/Objective:    Suicidal ideation, hallucination                Action/Plan: Discharge Planning: Chart reviewed. CSW working of placement to [employer] placement.   Expected Discharge Date:  05/27/16               [facility] Discharge Plan:  [facility]  In-House Referral:  Clinical Social Work  Discharge planning Services  CM Consult  Post Acute Care Choice:  NA Choice offered to:  NA  DME Arranged:  N/A DME Agency:  NA  HH Arranged:  NA HH Agency:  NA  Status of Service:  Completed, signed off  If discussed at [employer] of Stay Meetings, dates discussed:    Additional Comments:  Elliot Cousin, RN 05/28/2016, 10:15 AM

## 2016-05-29 ENCOUNTER — Inpatient Hospital Stay (HOSPITAL_COMMUNITY)
Admission: AD | Admit: 2016-05-29 | Discharge: 2016-05-30 | DRG: 885 | Disposition: A | Discharge status: Home / Self Care | Payer: Federal, State, Local not specified - Other | Attending: Psychiatry | Admitting: Psychiatry

## 2016-05-29 ENCOUNTER — Encounter (HOSPITAL_COMMUNITY): Payer: Self-pay

## 2016-05-29 DIAGNOSIS — Z6839 Body mass index (BMI) 39.0-39.9, adult: Secondary | ICD-10-CM | POA: Diagnosis not present

## 2016-05-29 DIAGNOSIS — Z811 Family history of alcohol abuse and dependence: Secondary | ICD-10-CM | POA: Diagnosis not present

## 2016-05-29 DIAGNOSIS — F17200 Nicotine dependence, unspecified, uncomplicated: Secondary | ICD-10-CM | POA: Diagnosis present

## 2016-05-29 DIAGNOSIS — F419 Anxiety disorder, unspecified: Secondary | ICD-10-CM | POA: Diagnosis present

## 2016-05-29 DIAGNOSIS — Z955 Presence of coronary angioplasty implant and graft: Secondary | ICD-10-CM

## 2016-05-29 DIAGNOSIS — Z818 Family history of other mental and behavioral disorders: Secondary | ICD-10-CM | POA: Diagnosis not present

## 2016-05-29 DIAGNOSIS — R45851 Suicidal ideations: Secondary | ICD-10-CM | POA: Diagnosis present

## 2016-05-29 DIAGNOSIS — F250 Schizoaffective disorder, bipolar type: Secondary | ICD-10-CM | POA: Diagnosis not present

## 2016-05-29 DIAGNOSIS — F1020 Alcohol dependence, uncomplicated: Secondary | ICD-10-CM | POA: Diagnosis present

## 2016-05-29 DIAGNOSIS — Z915 Personal history of self-harm: Secondary | ICD-10-CM

## 2016-05-29 DIAGNOSIS — F17210 Nicotine dependence, cigarettes, uncomplicated: Secondary | ICD-10-CM | POA: Diagnosis not present

## 2016-05-29 DIAGNOSIS — F4310 Post-traumatic stress disorder, unspecified: Secondary | ICD-10-CM | POA: Diagnosis not present

## 2016-05-29 DIAGNOSIS — Z813 Family history of other psychoactive substance abuse and dependence: Secondary | ICD-10-CM | POA: Diagnosis not present

## 2016-05-29 DIAGNOSIS — Z79899 Other long term (current) drug therapy: Secondary | ICD-10-CM | POA: Diagnosis not present

## 2016-05-29 DIAGNOSIS — F1290 Cannabis use, unspecified, uncomplicated: Secondary | ICD-10-CM | POA: Diagnosis not present

## 2016-05-29 LAB — CULTURE, BLOOD (ROUTINE X 2)

## 2016-05-29 MED ORDER — TRAZODONE HCL 100 MG PO TABS
100.0000 mg | ORAL_TABLET | Freq: Every evening | ORAL | Status: DC | PRN
  Administered 2016-05-29: 100 mg via ORAL
  Filled 2016-05-29: qty 7
  Filled 2016-05-29: qty 1

## 2016-05-29 MED ORDER — IBUPROFEN 400 MG PO TABS: 600.0000 mg | ORAL_TABLET | Freq: Four times a day (QID) | ORAL | Status: DC | PRN

## 2016-05-29 MED ORDER — CHLORPROMAZINE HCL 25 MG PO TABS
25.0000 mg | ORAL_TABLET | Freq: Two times a day (BID) | ORAL | Status: DC | PRN
  Administered 2016-05-29 – 2016-05-30 (×2): 25 mg via ORAL
  Filled 2016-05-29 (×2): qty 1

## 2016-05-29 MED ORDER — SERTRALINE HCL 100 MG PO TABS
100.0000 mg | ORAL_TABLET | Freq: Every day | ORAL | Status: DC
  Administered 2016-05-30: 100 mg via ORAL
  Filled 2016-05-29 (×3): qty 1

## 2016-05-29 MED ORDER — ACETAMINOPHEN 325 MG PO TABS: 650.0000 mg | ORAL_TABLET | Freq: Four times a day (QID) | ORAL | Status: DC | PRN

## 2016-05-29 MED ORDER — CHLORPROMAZINE HCL 100 MG PO TABS
200.0000 mg | ORAL_TABLET | Freq: Every day | ORAL | Status: DC
  Administered 2016-05-29: 200 mg via ORAL
  Filled 2016-05-29 (×3): qty 2

## 2016-05-29 MED ORDER — VITAMIN B-1 100 MG PO TABS
100.0000 mg | ORAL_TABLET | Freq: Every day | ORAL | Status: DC
  Administered 2016-05-29 – 2016-05-30 (×2): 100 mg via ORAL
  Filled 2016-05-29 (×5): qty 1

## 2016-05-29 MED ORDER — NICOTINE 21 MG/24HR TD PT24
21.0000 mg | MEDICATED_PATCH | Freq: Every day | TRANSDERMAL | Status: DC
  Administered 2016-05-29 – 2016-05-30 (×2): 21 mg via TRANSDERMAL
  Filled 2016-05-29 (×4): qty 1

## 2016-05-29 MED ORDER — MAGNESIUM HYDROXIDE 400 MG/5ML PO SUSP: 30.0000 mL | Freq: Every day | ORAL | Status: DC | PRN

## 2016-05-29 MED ORDER — BUSPIRONE HCL 5 MG PO TABS
5.0000 mg | ORAL_TABLET | Freq: Three times a day (TID) | ORAL | Status: DC
  Administered 2016-05-29 – 2016-05-30 (×3): 5 mg via ORAL
  Filled 2016-05-29 (×9): qty 1

## 2016-05-29 NOTE — Clinical Social Work Psych Note (Signed)
Clinical Social Worker Psych Service Line Progress Note  Clinical Social Worker: Lia Hopping, LCSW Date/Time: 05/29/2016, 11:02 AM   Review of Patient  Overall Medical Condition:  Medically stable to discharge back to inpatient psychiatric unit  Participation Level:  Active Participation Quality: Appropriate Other Participation Quality: Cooperative   Affect: Appropriate Cognitive: Appropriate Reaction to Medications/Concerns:  No concerns with medication at this time.   Modes of Intervention: Support   Summary of Progress/Plan at Discharge  Summary of Progress/Plan at Discharge: CSW met with patient at bedside, explain role. Patient agreeable to talk with CSW. Patient was sent to [facility] under observation. Patient reports she went to [facility] for hallucinations and suicidal ideations. Patient reports 4 day inpatient [facility] stay was helpful. Patient reports she is no longer feeling SI or is having hallucinations. The patient is hopeful to see the psychiatrist. Patient discussed Plan of Care, to follow up with [facility] and outpatient therapist. Patient is on disability and has [employer], patient has to renew application becausee she did not send in a renewal application a few weeks ago.   Plan of Discharge: is to continue to assist with placement.

## 2016-05-29 NOTE — Progress Notes (Signed)
[facility] called for Transport.  
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
## 2016-05-29 NOTE — Progress Notes (Signed)
Patient has been accepted to [facility] Room 504 Bed 2. Patient under IVC , will transport by [employer].  IVC faxed and received at [facility].  Physician informed. No other needs identified at this time.   Vivi Barrack, Theresia Majors, MSW Clinical Social Worker 5E and Psychiatric Service Line [phone] 05/29/2016  1:15 PM

## 2016-05-29 NOTE — Tx Team (Addendum)
Initial Treatment Plan 05/29/2016 7:20 PM [name] ZOX:[id number]    PATIENT STRESSORS: Substance abuse Traumatic event   PATIENT STRENGTHS: Ability for insight Communication skills Supportive family/friends   PATIENT IDENTIFIED PROBLEMS: "I am hearing negative voices"   "I am seeing shadowy men."     SI   Depression             DISCHARGE CRITERIA:  Motivation to continue treatment in a less acute level of care Reduction of life-threatening or endangering symptoms to within safe limits  PRELIMINARY DISCHARGE PLAN: Return to previous living arrangement  PATIENT/FAMILY INVOLVEMENT: This treatment plan has been presented to and reviewed with the patient, [name], and/or family member, he patient and family have been given the opportunity to ask questions and make suggestions.  Jerrye Bushy, RN 05/29/2016, 7:20 PM

## 2016-05-29 NOTE — Progress Notes (Signed)
Patient d/c in [employer] custody.  Discharge instructions explained and understood per patient.

## 2016-05-30 ENCOUNTER — Encounter (HOSPITAL_COMMUNITY): Payer: Self-pay | Admitting: Psychiatry

## 2016-05-30 DIAGNOSIS — F250 Schizoaffective disorder, bipolar type: Principal | ICD-10-CM

## 2016-05-30 DIAGNOSIS — F1290 Cannabis use, unspecified, uncomplicated: Secondary | ICD-10-CM

## 2016-05-30 DIAGNOSIS — F17200 Nicotine dependence, unspecified, uncomplicated: Secondary | ICD-10-CM | POA: Diagnosis present

## 2016-05-30 DIAGNOSIS — Z79899 Other long term (current) drug therapy: Secondary | ICD-10-CM

## 2016-05-30 DIAGNOSIS — F1020 Alcohol dependence, uncomplicated: Secondary | ICD-10-CM | POA: Diagnosis present

## 2016-05-30 DIAGNOSIS — Z818 Family history of other mental and behavioral disorders: Secondary | ICD-10-CM

## 2016-05-30 DIAGNOSIS — Z813 Family history of other psychoactive substance abuse and dependence: Secondary | ICD-10-CM

## 2016-05-30 DIAGNOSIS — F17210 Nicotine dependence, cigarettes, uncomplicated: Secondary | ICD-10-CM

## 2016-05-30 DIAGNOSIS — F4310 Post-traumatic stress disorder, unspecified: Secondary | ICD-10-CM | POA: Clinically undetermined

## 2016-05-30 DIAGNOSIS — Z811 Family history of alcohol abuse and dependence: Secondary | ICD-10-CM

## 2016-05-30 MED ORDER — HYDROXYZINE HCL 25 MG PO TABS
25.0000 mg | ORAL_TABLET | Freq: Four times a day (QID) | ORAL | Status: DC | PRN
  Filled 2016-05-30: qty 10

## 2016-05-30 MED ORDER — NICOTINE 21 MG/24HR TD PT24: 21.0000 mg | MEDICATED_PATCH | Freq: Every day | TRANSDERMAL | 0 refills | Status: DC

## 2016-05-30 MED ORDER — CHLORPROMAZINE HCL 25 MG PO TABS: 25.0000 mg | ORAL_TABLET | Freq: Every day | ORAL | 0 refills | Status: DC

## 2016-05-30 MED ORDER — BUSPIRONE HCL 5 MG PO TABS: 5.0000 mg | ORAL_TABLET | Freq: Three times a day (TID) | ORAL | 0 refills | Status: DC

## 2016-05-30 MED ORDER — TRAZODONE HCL 100 MG PO TABS: 100.0000 mg | ORAL_TABLET | Freq: Every evening | ORAL | 0 refills | Status: DC | PRN

## 2016-05-30 MED ORDER — HYDROXYZINE HCL 25 MG PO TABS: 25.0000 mg | ORAL_TABLET | Freq: Four times a day (QID) | ORAL | 0 refills | Status: DC | PRN

## 2016-05-30 MED ORDER — CHLORPROMAZINE HCL 50 MG PO TABS
175.0000 mg | ORAL_TABLET | Freq: Every day | ORAL | Status: DC
  Filled 2016-05-30: qty 25

## 2016-05-30 MED ORDER — CHLORPROMAZINE HCL 50 MG PO TABS
175.0000 mg | ORAL_TABLET | Freq: Every day | ORAL | Status: DC
  Filled 2016-05-30 (×2): qty 1

## 2016-05-30 MED ORDER — CHLORPROMAZINE HCL 25 MG PO TABS
25.0000 mg | ORAL_TABLET | Freq: Every day | ORAL | Status: DC
  Filled 2016-05-30 (×3): qty 1

## 2016-05-30 MED ORDER — CHLORPROMAZINE HCL 50 MG PO TABS: 175.0000 mg | ORAL_TABLET | Freq: Every day | ORAL | 0 refills | Status: DC

## 2016-05-30 MED ORDER — SERTRALINE HCL 100 MG PO TABS: 100.0000 mg | ORAL_TABLET | Freq: Every day | ORAL | 0 refills | Status: DC

## 2016-05-30 NOTE — Progress Notes (Signed)
Pt is on unit in day room.  Pt sts the anxiety medication did calm her down but wants something for depression.  Pt sts she was on 300 hall a week ago and [facility].  Pt sts she hears dark voices.  Pt denies pain or discomfort and does contract for safety.  Pt watches movie and then goes to bed.  Pt remains safe on unit.
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
## 2016-05-30 NOTE — BHH Suicide Risk Assessment (Signed)
[facility] admission/ Discharge Suicide Risk Assessment   Principal Problem: Schizoaffective disorder, bipolar type [facility]) Discharge Diagnoses:  Patient Active Problem List   Diagnosis Date Noted  . PTSD (post-traumatic stress disorder) [F43.10] 05/30/2016  . Alcohol use disorder, moderate, dependence (HCC) [F10.20] 05/30/2016  . Tobacco use disorder [F17.200] 05/30/2016  . Positive blood cultures [R78.81] 05/27/2016  . Alcohol abuse [F10.10] 05/27/2016  . Cigarette smoker [F17.210] 05/27/2016  . Morbid obesity (HCC) [E66.01] 05/27/2016  . Anxiety [F41.9] 05/27/2016  . Substance use disorder [F19.90] 05/24/2016  . Schizoaffective disorder, bipolar type (HCC) [F25.0] 05/31/2011    Total Time spent with patient: 30 minutes  Musculoskeletal: Strength & Muscle Tone: within normal limits Gait & Station: normal Patient leans: N/A  Psychiatric Specialty Exam: Review of Systems  Psychiatric/Behavioral: Positive for substance abuse. Negative for depression, hallucinations and suicidal ideas. The patient is not nervous/anxious and does not have insomnia.   All other systems reviewed and are negative.   Blood pressure (!) 95/54, pulse 94, temperature 98 F (36.7 C), temperature source Oral, resp. rate 18, height 5' 5.5" (1.664 m), weight 108 kg (238 lb), last menstrual period 05/29/2016.Body mass index is 39 kg/m.  General Appearance: Casual  Eye Contact::  Fair  Speech:  Clear and Coherent409  Volume:  Normal  Mood:  Euthymic  Affect:  Congruent  Thought Process:  Goal Directed and Descriptions of Associations: Intact  Orientation:  Full (Time, Place, and Person)  Thought Content:  Logical  Suicidal Thoughts:  No  Homicidal Thoughts:  No  Memory:  Immediate;   Fair Recent;   Fair Remote;   Fair  Judgement:  Fair  Insight:  Fair  Psychomotor Activity:  Normal  Concentration:  Fair  Recall:  [employer] of Knowledge:Fair  Language: Fair  Akathisia:  No  Handed:  Right  AIMS (if  indicated):     Assets:  Communication Skills Desire for Improvement  Sleep:     Cognition: WNL  ADL's:  Intact   Mental Status Per Nursing Assessment::   On Admission:     Demographic Factors:  Caucasian  Loss Factors: NA  Historical Factors: Impulsivity  Risk Reduction Factors:   Positive social support and Positive therapeutic relationship  Continued Clinical Symptoms:  Alcohol/Substance Abuse/Dependencies Previous Psychiatric Diagnoses and Treatments  Cognitive Features That Contribute To Risk:  None    Suicide Risk:  Minimal: No identifiable suicidal ideation.  Patients presenting with no risk factors but with morbid ruminations; may be classified as minimal risk based on the severity of the depressive symptoms  Follow-up Information    [facility] Follow up.   Specialty:  Behavioral Health Why:  Go to the walk-in clinic with in the next 5 days between 8 and 11AM for your hospital follow up appointment Contact information: [address] [place] [address] [phone]        [facility] Follow up on 06/02/2016.   Specialty:  Behavioral Health Why:  Friday at 8AM with Alimah B.  Call tomorrow [Wed] to confirm or reschedule Contact information: [address] [place] [address] [phone]           Plan Of Care/Follow-up recommendations: [name] presents today as calm, denies any mood sx, she is goal oriented , alert, denies SI/HI/AH/VH. Contacted her SO -Kathleen Argue - who reported he is comfortable taking her home and can observe her closely and get help if needed. He did not feel she was  a danger to self or others. [name] is preoccupied with discharge , and hence will DC her to outpatient care. Activity:  no restrictions Diet:  low calorie Tests:  as needed Other:  follow up with your aftercare as well as PCP in 1-2 weeks  Amador Braddy, MD 05/30/2016, 11:00 AM

## 2016-05-30 NOTE — Progress Notes (Signed)
  [facility] Adult Case Management Discharge Plan :  Will you be returning to the same living situation after discharge:  Yes,  home At discharge, do you have transportation home?: Yes,  Either family or bus pass Do you have the ability to pay for your medications: Yes,  MCD  Release of information consent forms completed and in the chart;  Patient's signature needed at discharge.  Patient to Follow up at: Follow-up Information    [facility] Follow up.   Specialty:  Behavioral Health Why:  Go to the walk-in clinic with in the next 5 days between 8 and 11AM for your hospital follow up appointment Contact information: [address] [place] [address] [phone]        [facility] Follow up on 05/31/2016.   Specialty:  [facility] information: [address] [place], [address] [place] [address] [phone]           Next level of care provider has access to [facility] Link:no  Safety Planning and Suicide Prevention discussed: Yes,  yes  Have you used any form of tobacco in the last 30 days? (Cigarettes, Smokeless Tobacco, Cigars, and/or Pipes): Yes  Has patient been referred to the Quitline?: Patient refused referral  Patient has been referred for addiction treatment: Yes  Ida Rogue 05/30/2016, 10:20 AM

## 2016-05-30 NOTE — Progress Notes (Signed)
Recreation Therapy Notes  Date: 05/30/16 Time: 1000 Location: 500 Hall Dayroom  Group Topic: Communication  Goal Area(s) Addresses:  Patient will effectively communicate with peers in group.  Patient will verbalize benefit of healthy communication. Patient will verbalize positive effect of healthy communication on post d/c goals.  Patient will identify communication techniques that made activity effective for group.   Behavioral Response: Engaged  Intervention:  3 Small [employer], Chairs  Activity: Group Juggle.  Patients were seated in a circle.  LRT put one ball in play to give the group an opportunity to get a rhythm going.  Once the found a rhythm, LRT added another ball to rotation.  Once again when the patients found a rhythm, the last ball was put into play.  LRT would time the patients to see how long they could keep the balls moving without the balls coming to a stop.  Patients were to keep the balls going.  Patients could bounce the balls off of the floor but the balls could not come to a complete stop.  If one ball stopped moving, the time would start over.  Education: Communication, Discharge Planning  Education Outcome: Acknowledges understanding/In group clarification offered/Needs additional education.   Clinical Observations/Feedback: Pt was bright and smiling during group.  Pt worked well with her peers to keep the balls going.  Pt was receptive to peers giving suggestions on how the group could complete the activity.   [name], LRT/CTRS      [name] A 05/30/2016 11:44 AM

## 2016-05-30 NOTE — Discharge Summary (Signed)
Physician Discharge Summary Note  Patient:  [name] is an 36 y.o., female MRN:  [id number] DOB:  10-19-1980 Patient phone:  [phone] (home)  Patient address:   [address] [place] [address],  Total Time spent with patient: 30 minutes  Date of Admission:  05/29/2016 Date of Discharge: 05/30/2016  Reason for Admission:  Worsening depression and suicidal ideation.    Principal Problem: Schizoaffective disorder, bipolar type [facility]) Discharge Diagnoses: Patient Active Problem List   Diagnosis Date Noted  . PTSD (post-traumatic stress disorder) [F43.10] 05/30/2016  . Alcohol use disorder, moderate, dependence (HCC) [F10.20] 05/30/2016  . Tobacco use disorder [F17.200] 05/30/2016  . Positive blood cultures [R78.81] 05/27/2016  . Alcohol abuse [F10.10] 05/27/2016  . Cigarette smoker [F17.210] 05/27/2016  . Morbid obesity (HCC) [E66.01] 05/27/2016  . Anxiety [F41.9] 05/27/2016  . Substance use disorder [F19.90] 05/24/2016  . Schizoaffective disorder, bipolar type (HCC) [F25.0] 05/31/2011    Past Psychiatric History:  See HPI  Past Medical History:  Past Medical History:  Diagnosis Date  . Anemia   . Anxiety   . Asthma   . Depression   . Headache(784.0)   . Pneumonia   . Substance use disorder 05/24/2016    Past Surgical History:  Procedure Laterality Date  . CESAREAN SECTION  2006  . THYROID SURGERY  2003   Family History:  Family History  Problem Relation Age of Onset  . Mental illness Mother   . Alcohol abuse Father   . Drug abuse Father    Family Psychiatric  History: see HPI Social History:  History  Alcohol Use  . 10.2 oz/week  . 2 Glasses of wine, 12 Cans of beer, 3 Shots of liquor per week     History  Drug Use  . Types: Marijuana    Comment: THC used once last month    Social History   Social History  . Marital status: Single    Spouse name: N/A  . Number of children: N/A  . Years of education: N/A   Social History Main Topics   . Smoking status: Current Every Day Smoker    Packs/day: 0.50  . Smokeless tobacco: Never Used  . Alcohol use 10.2 oz/week    2 Glasses of wine, 12 Cans of beer, 3 Shots of liquor per week  . Drug use: Yes    Types: Marijuana     Comment: THC used once last month  . Sexual activity: Yes    Birth control/ protection: Injection     Comment: Depo--thinks she due for her next shot this month/ LMP unable to recall   Other Topics Concern  . None   Social History Narrative  . None    Hospital Course:  [name] is a 36 y.o. female withPMHx of anxiety, depression, and schizoaffective disorder, who presented to the Emergency Department complaining of gradually worsening suicidal ideation beginning several weeks ago. She stated that she has a current plan of lacerating her own wrist.  [name] was admitted for Schizoaffective disorder, bipolar type (HCC) and crisis management.  Patient was treated with medications with their indications listed below in detail under Medication List.  Medical problems were identified and treated as needed.  Home medications were restarted as appropriate.  Improvement was monitored by observation and [name] daily report of symptom reduction.  Emotional and mental status was monitored by daily self inventory reports completed by [name] and clinical staff.  Patient reported continued improvement, denied any  new concerns.  Patient had been compliant on medications and denied side effects.  Support and encouragement was provided.    Patient encouraged to attend groups to help with recognizing triggers of emotional crises and de-stabilizations.  Patient encouraged to attend group to help identify the positive things in life that would help in dealing with feelings of loss, depression and unhealthy or abusive tendencies.         [name] was evaluated by the treatment team for stability and plans for continued recovery upon discharge.  Patient  was offered further treatment options upon discharge including Residential, Intensive Outpatient and Outpatient treatment. Patient will follow up with agency listed below for medication management and counseling.  Encouraged patient to maintain satisfactory support network and home environment.  Advised to adhere to medication compliance and outpatient treatment follow up.  Prescriptions provided.       [name] motivation was an integral factor for scheduling further treatment.  Employment, transportation, bed availability, health status, family support, and any pending legal issues were also considered during patient's hospital stay.  Upon completion of this admission the patient was both mentally and medically stable for discharge denying suicidal/homicidal ideation, auditory/visual/tactile hallucinations, delusional thoughts and paranoia.      Physical Findings: AIMS: Facial and Oral Movements Muscles of Facial Expression: None, normal Lips and Perioral Area: None, normal Jaw: None, normal Tongue: None, normal,Extremity Movements Upper (arms, wrists, hands, fingers): None, normal Lower (legs, knees, ankles, toes): None, normal, Trunk Movements Neck, shoulders, hips: None, normal, Overall Severity Severity of abnormal movements (highest score from questions above): None, normal Incapacitation due to abnormal movements: None, normal Patient's awareness of abnormal movements (rate only patient's report): No Awareness, Dental Status Current problems with teeth and/or dentures?: No Does patient usually wear dentures?: No  CIWA:  CIWA-Ar Total: 0 COWS:  COWS Total Score: 0  Musculoskeletal: Strength & Muscle Tone: within normal limits Gait & Station: normal Patient leans: N/A  Psychiatric Specialty Exam:  See MD SRA Physical Exam  Nursing note and vitals reviewed.   ROS  Blood pressure 114/68, pulse (!) 111, temperature 98 F (36.7 C), temperature source Oral, resp. rate 18, height  5' 5.5" (1.664 m), weight 108 kg (238 lb), last menstrual period 05/29/2016.Body mass index is 39 kg/m.   Have you used any form of tobacco in the last 30 days? (Cigarettes, Smokeless Tobacco, Cigars, and/or Pipes): Yes  Has this patient used any form of tobacco in the last 30 days? (Cigarettes, Smokeless Tobacco, Cigars, and/or Pipes) Yes, N/A  Blood Alcohol level:  Lab Results  Component Value Date   ETH <5 05/22/2016   ETH <11 05/31/2011    Metabolic Disorder Labs:  No results found for: HGBA1C, MPG No results found for: PROLACTIN No results found for: CHOL, TRIG, HDL, CHOLHDL, VLDL, LDLCALC  See Psychiatric Specialty Exam and Suicide Risk Assessment completed by Attending Physician prior to discharge.  Discharge destination:  Home  Is patient on multiple antipsychotic therapies at discharge:  No   Has Patient had three or more failed trials of antipsychotic monotherapy by history:  No  Recommended Plan for Multiple Antipsychotic Therapies: NA   Allergies as of 05/30/2016   No Known Allergies     Medication List    TAKE these medications     Indication  busPIRone 5 MG tablet Commonly known as:  BUSPAR Take 1 tablet (5 mg total) by mouth 3 (three) times daily.  Indication:  Anxiety Disorder, Depression  chlorproMAZINE 25 MG tablet Commonly known as:  THORAZINE Take 1 tablet (25 mg total) by mouth daily after lunch. What changed:  medication strength  how much to take  when to take this  Indication:  mood stabilization   chlorproMAZINE 50 MG tablet Commonly known as:  THORAZINE Take 3.5 tablets (175 mg total) by mouth at bedtime. What changed:  You were already taking a medication with the same name, and this prescription was added. Make sure you understand how and when to take each.  Indication:  mood stability   hydrOXYzine 25 MG tablet Commonly known as:  ATARAX/VISTARIL Take 1 tablet (25 mg total) by mouth every 6 (six) hours as needed for anxiety.   Indication:  Anxiety Neurosis   nicotine 21 mg/24hr patch Commonly known as:  NICODERM CQ - dosed in mg/24 hours Place 1 patch (21 mg total) onto the skin daily. Start taking on:  05/31/2016  Indication:  Nicotine Addiction   sertraline 100 MG tablet Commonly known as:  ZOLOFT Take 1 tablet (100 mg total) by mouth daily. Start taking on:  05/31/2016  Indication:  Major Depressive Disorder   traZODone 100 MG tablet Commonly known as:  DESYREL Take 1 tablet (100 mg total) by mouth at bedtime as needed for sleep.  Indication:  Major Depressive Disorder      Follow-up Information    [facility] Follow up.   Specialty:  Behavioral Health Why:  Go to the walk-in clinic with in the next 5 days between 8 and 11AM for your hospital follow up appointment Contact information: [address] [place] [address] [phone]        [facility] Follow up on 06/02/2016.   Specialty:  Behavioral Health Why:  Friday at 8AM with Alimah B.  Call tomorrow [Wed] to confirm or reschedule Contact information: [address] [place], [address] [place] [address] [phone]           Follow-up recommendations:  Activity:  as tol Diet:  as tol  Comments:  1.  Take all your medications as prescribed.   2.  Report any adverse side effects to outpatient provider. 3.  Patient instructed to not use alcohol or illegal drugs while on prescription medicines. 4.  In the event of worsening symptoms, instructed patient to call 911, the crisis hotline or go to nearest emergency room for evaluation of symptoms.  Signed: Lindwood Qua, NP [facility] 05/30/2016, 1:43 PM

## 2016-05-30 NOTE — Progress Notes (Signed)
D: Pt animated but anxious "I just want to go home, I've been here for a week now". Pt denies SI, HI, AVH and pain when assessed. Cooperative with care, attended scheduled groups. D/C home as per physician's order. Bus pass given.  A: D/C instructions reviewed with pt including follow up appointments, prescriptions and medication samples. All medications administered per MD's orders and effects monitored. Belongings in locker 4 returned to pt prior to d/c. Encouraged pt to voice concerns, comply with medications as ordered. Q 15 minutes checks maintained for safety without incident. R: Pt receptive to care. Verbalized understanding related to d/c teaching. Signed belonging sheet in agreement with items received. Denies concerns at this time. Ambulatory with a steady gait. Appears to be in no physical distress at this time.
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
## 2016-05-30 NOTE — [facility] Suicide Risk Assessment (Signed)
[facility] INPATIENT:  Family/Significant Other Suicide Prevention Education  Suicide Prevention Education:  Education Completed; Kathleen Argue, [place], [phone] has been identified by the patient as the family member/significant other with whom the patient will be residing, and identified as the person(s) who will aid the patient in the event of a mental health crisis (suicidal ideations/suicide attempt).  With written consent from the patient, the family member/significant other has been provided the following suicide prevention education, prior to the and/or following the discharge of the patient.  The suicide prevention education provided includes the following:  Suicide risk factors  Suicide prevention and interventions  National Suicide Hotline telephone number  [facility] assessment telephone number  [facility] Emergency Assistance 911  [facility] and/or Residential Mobile Crisis Unit telephone number  Request made of family/significant other to:  Remove weapons (e.g., guns, rifles, knives), all items previously/currently identified as safety concern.    Remove drugs/medications (over-the-counter, prescriptions, illicit drugs), all items previously/currently identified as a safety concern.  The family member/significant other verbalizes understanding of the suicide prevention education information provided.  The family member/significant other agrees to remove the items of safety concern listed above.  Baldo Daub [facility] 05/30/2016, 10:22 AM

## 2016-05-31 LAB — CULTURE, BLOOD (ROUTINE X 2)
Culture: NO GROWTH
Culture: NO GROWTH

## 2016-07-02 ENCOUNTER — Encounter (HOSPITAL_COMMUNITY): Payer: Self-pay | Admitting: Emergency Medicine

## 2016-07-02 ENCOUNTER — Emergency Department (HOSPITAL_COMMUNITY)
Admission: EM | Admit: 2016-07-02 | Discharge: 2016-07-03 | Disposition: A | Discharge status: Other Acute Inpatient Hospital | Payer: Self-pay | Attending: Emergency Medicine | Admitting: Emergency Medicine

## 2016-07-02 DIAGNOSIS — J45909 Unspecified asthma, uncomplicated: Secondary | ICD-10-CM | POA: Insufficient documentation

## 2016-07-02 DIAGNOSIS — R45851 Suicidal ideations: Secondary | ICD-10-CM

## 2016-07-02 DIAGNOSIS — Z79899 Other long term (current) drug therapy: Secondary | ICD-10-CM | POA: Insufficient documentation

## 2016-07-02 DIAGNOSIS — F17200 Nicotine dependence, unspecified, uncomplicated: Secondary | ICD-10-CM | POA: Insufficient documentation

## 2016-07-02 DIAGNOSIS — F1010 Alcohol abuse, uncomplicated: Secondary | ICD-10-CM | POA: Insufficient documentation

## 2016-07-02 HISTORY — DX: Schizoaffective disorder, bipolar type: F25.0

## 2016-07-02 HISTORY — DX: Schizoaffective disorder, unspecified: F25.9

## 2016-07-02 LAB — COMPREHENSIVE METABOLIC PANEL
ALBUMIN: 3.7 g/dL (ref 3.5–5.0)
ALK PHOS: 76 U/L (ref 38–126)
ALT: 23 U/L (ref 14–54)
AST: 26 U/L (ref 15–41)
Anion gap: 10 (ref 5–15)
BILIRUBIN TOTAL: 0.3 mg/dL (ref 0.3–1.2)
BUN: 5 mg/dL — AB (ref 6–20)
CO2: 22 mmol/L (ref 22–32)
Calcium: 8.7 mg/dL — ABNORMAL LOW (ref 8.9–10.3)
Chloride: 109 mmol/L (ref 101–111)
Creatinine, Ser: 0.76 mg/dL (ref 0.44–1.00)
GFR calc Af Amer: >60 mL/min (ref >60)
GFR calc non Af Amer: >60 mL/min (ref >60)
GLUCOSE: 113 mg/dL — AB (ref 65–99)
Potassium: 4 mmol/L (ref 3.5–5.1)
Sodium: 141 mmol/L (ref 135–145)
Total Protein: 6.5 g/dL (ref 6.5–8.1)

## 2016-07-02 LAB — CBC
HCT: 39.9 % (ref 36.0–46.0)
HEMOGLOBIN: 13.1 g/dL (ref 12.0–15.0)
MCH: 30.7 pg (ref 26.0–34.0)
MCHC: 32.8 g/dL (ref 30.0–36.0)
MCV: 93.4 fL (ref 78.0–100.0)
Platelets: 293 10*3/uL (ref 150–400)
RBC: 4.27 MIL/uL (ref 3.87–5.11)
RDW: 13.5 % (ref 11.5–15.5)
WBC: 13.1 10*3/uL — ABNORMAL HIGH (ref 4.0–10.5)

## 2016-07-02 LAB — ACETAMINOPHEN LEVEL: Acetaminophen (Tylenol), Serum: <10 ug/mL — ABNORMAL LOW (ref 10–30)

## 2016-07-02 LAB — RAPID URINE DRUG SCREEN, HOSP PERFORMED
AMPHETAMINES: NOT DETECTED
BARBITURATES: NOT DETECTED
Benzodiazepines: POSITIVE — AB
COCAINE: POSITIVE — AB
OPIATES: NOT DETECTED
TETRAHYDROCANNABINOL: NOT DETECTED

## 2016-07-02 LAB — ETHANOL: Alcohol, Ethyl (B): 178 mg/dL — ABNORMAL HIGH (ref <5)

## 2016-07-02 LAB — SALICYLATE LEVEL

## 2016-07-02 LAB — PREGNANCY, URINE: Preg Test, Ur: NEGATIVE

## 2016-07-02 MED ORDER — ONDANSETRON HCL 4 MG PO TABS: 4.0000 mg | ORAL_TABLET | Freq: Three times a day (TID) | ORAL | Status: DC | PRN

## 2016-07-02 MED ORDER — LORAZEPAM 1 MG PO TABS
1.0000 mg | ORAL_TABLET | Freq: Three times a day (TID) | ORAL | Status: DC | PRN
  Administered 2016-07-03: 1 mg via ORAL
  Filled 2016-07-02: qty 1

## 2016-07-02 MED ORDER — ALUM & MAG HYDROXIDE-SIMETH 200-200-20 MG/5ML PO SUSP: 30.0000 mL | ORAL | Status: DC | PRN

## 2016-07-02 MED ORDER — IBUPROFEN 400 MG PO TABS: 600.0000 mg | ORAL_TABLET | Freq: Three times a day (TID) | ORAL | Status: DC | PRN

## 2016-07-02 MED ORDER — ACETAMINOPHEN 325 MG PO TABS: 650.0000 mg | ORAL_TABLET | ORAL | Status: DC | PRN

## 2016-07-02 NOTE — ED Notes (Signed)
Aquicha with TTS at bedside.
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
## 2016-07-02 NOTE — ED Provider Notes (Signed)
[facility]EMER[facility]ENCY DEPT Provider Note   CSN: [id number] Arrival date & time: 07/02/16  1927     History   Chief Complaint Chief Complaint  Patient presents with  . Suicidal    HPI [name] is a 36 y.o. female.  [name] is a 36 y.o. Female with a history of depression and schizophrenia who presents to the emergency department complaining of depression and suicidal ideations today. She reports she has a plan to cut her wrists today. She also admits to drinking 4 beers today. She denies any attempts to harm herself today. She denies taking any medications in overdose today. She is a history of depression, bipolar and schizophrenia. She has not been taking her psychiatric medications for at least a month she reports. She tells me she is not followed by a therapist or psychiatrist. She tells me she cannot afford her medications. She denies homicidal ideations. She denies visual or auditory hallucinations. She denies physical complaints. She denies fevers, recent illness, cough, chest pain, abdominal pain, nausea, vomiting or rashes.   The history is provided by the patient, medical records and the police. No language interpreter was used.    Past Medical History:  Diagnosis Date  . Anemia   . Anxiety   . Asthma   . Depression   . Headache(784.0)   . Pneumonia   . Substance use disorder 05/24/2016    Patient Active Problem List   Diagnosis Date Noted  . PTSD (post-traumatic stress disorder) 05/30/2016  . Alcohol use disorder, moderate, dependence (HCC) 05/30/2016  . Tobacco use disorder 05/30/2016  . Positive blood cultures 05/27/2016  . Alcohol abuse 05/27/2016  . Cigarette smoker 05/27/2016  . Morbid obesity (HCC) 05/27/2016  . Anxiety 05/27/2016  . Substance use disorder 05/24/2016  . Schizoaffective disorder, bipolar type (HCC) 05/31/2011    Past Surgical History:  Procedure Laterality Date  . CESAREAN SECTION  2006  . THYROID SURGERY  2003    OB History    No data available       Home Medications    Prior to Admission medications   Medication Sig Start Date End Date Taking? Authorizing Provider  busPIRone (BUSPAR) 5 MG tablet Take 1 tablet (5 mg total) by mouth 3 (three) times daily. Patient not taking: Reported on 07/02/2016 05/30/16   Adonis Brook, NP  chlorproMAZINE (THORAZINE) 25 MG tablet Take 1 tablet (25 mg total) by mouth daily after lunch. Patient not taking: Reported on 07/02/2016 05/30/16   Adonis Brook, NP  chlorproMAZINE (THORAZINE) 50 MG tablet Take 3.5 tablets (175 mg total) by mouth at bedtime. Patient not taking: Reported on 07/02/2016 05/30/16   Adonis Brook, NP  hydrOXYzine (ATARAX/VISTARIL) 25 MG tablet Take 1 tablet (25 mg total) by mouth every 6 (six) hours as needed for anxiety. Patient not taking: Reported on 07/02/2016 05/30/16   Adonis Brook, NP  nicotine (NICODERM CQ - DOSED IN MG/24 HOURS) 21 mg/24hr patch Place 1 patch (21 mg total) onto the skin daily. Patient not taking: Reported on 07/02/2016 05/31/16   Adonis Brook, NP  sertraline (ZOLOFT) 100 MG tablet Take 1 tablet (100 mg total) by mouth daily. Patient not taking: Reported on 07/02/2016 05/31/16   Adonis Brook, NP  traZODone (DESYREL) 100 MG tablet Take 1 tablet (100 mg total) by mouth at bedtime as needed for sleep. Patient not taking: Reported on 07/02/2016 05/30/16   Adonis Brook, NP    Family History Family History  Problem Relation Age of Onset  . Mental  illness Mother   . Alcohol abuse Father   . Drug abuse Father     Social History Social History  Substance Use Topics  . Smoking status: Current Every Day Smoker    Packs/day: 0.50  . Smokeless tobacco: Never Used  . Alcohol use 10.2 oz/week    2 Glasses of wine, 12 Cans of beer, 3 Shots of liquor per week     Allergies   Patient has no known allergies.   Review of Systems Review of Systems  Constitutional: Negative for chills and fever.  HENT: Negative for congestion and sore throat.    Eyes: Negative for visual disturbance.  Respiratory: Negative for cough and shortness of breath.   Cardiovascular: Negative for chest pain.  Gastrointestinal: Negative for abdominal pain, diarrhea, nausea and vomiting.  Genitourinary: Negative for dysuria.  Musculoskeletal: Negative for back pain and neck pain.  Skin: Negative for rash.  Neurological: Negative for headaches.  Psychiatric/Behavioral: Positive for dysphoric mood and suicidal ideas. Negative for self-injury and sleep disturbance.     Physical Exam Updated Vital Signs BP (!) 103/58   Pulse 93   Temp 97.8 F (36.6 C) (Oral)   Resp 16   Ht 5\' 4"  (1.626 m)   SpO2 95%   Physical Exam  Constitutional: She is oriented to person, place, and time. She appears well-developed and well-nourished. No distress.  Nontoxic appearing. Obese female.  HENT:  Head: Normocephalic and atraumatic.  Eyes: Conjunctivae are normal. Pupils are equal, round, and reactive to light. Right eye exhibits no discharge. Left eye exhibits no discharge.  Neck: Neck supple.  Cardiovascular: Normal rate, regular rhythm, normal heart sounds and intact distal pulses.   Pulmonary/Chest: Effort normal and breath sounds normal. No respiratory distress.  Abdominal: Soft. There is no tenderness. There is no guarding.  Musculoskeletal: She exhibits no edema or tenderness.  Lymphadenopathy:    She has no cervical adenopathy.  Neurological: She is alert and oriented to person, place, and time. Coordination normal.  Skin: Skin is warm and dry. No rash noted. She is not diaphoretic. No erythema. No pallor.  Psychiatric: Her speech is normal. Thought content is not paranoid. She exhibits a depressed mood. She expresses suicidal ideation. She expresses no homicidal ideation. She expresses suicidal plans.  patient is tearful during interview. She appears depressed. Speech is clear and coherent. She admits to suicidal ideations with a plan to cut her wrist. Denies  homicidal ideations. She denies visual or auditory hallucinations.  Nursing note and vitals reviewed.    ED Treatments / Results  Labs (all labs ordered are listed, but only abnormal results are displayed) Labs Reviewed  CBC - Abnormal; Notable for the following:       Result Value   WBC 13.1 (*)    All other components within normal limits  COMPREHENSIVE METABOLIC PANEL - Abnormal; Notable for the following:    Glucose, Bld 113 (*)    BUN 5 (*)    Calcium 8.7 (*)    All other components within normal limits  ETHANOL - Abnormal; Notable for the following:    Alcohol, Ethyl (B) 178 (*)    All other components within normal limits  RAPID URINE DRUG SCREEN, HOSP PERFORMED - Abnormal; Notable for the following:    Cocaine POSITIVE (*)    Benzodiazepines POSITIVE (*)    All other components within normal limits  ACETAMINOPHEN LEVEL - Abnormal; Notable for the following:    Acetaminophen (Tylenol), Serum <10 (*)  All other components within normal limits  PREGNANCY, URINE  SALICYLATE LEVEL    EKG  EKG Interpretation None       Radiology No results found.  Procedures Procedures (including critical care time)  Medications Ordered in ED Medications  alum & mag hydroxide-simeth (MAALOX/MYLANTA) 200-200-20 MG/5ML suspension 30 mL (not administered)  ondansetron (ZOFRAN) tablet 4 mg (not administered)  ibuprofen (ADVIL,MOTRIN) tablet 600 mg (not administered)  acetaminophen (TYLENOL) tablet 650 mg (not administered)  LORazepam (ATIVAN) tablet 1 mg (not administered)     Initial Impression / Assessment and Plan / ED Course  I have reviewed the triage vital signs and the nursing notes.  Pertinent labs & imaging results that were available during my care of the patient were reviewed by me and considered in my medical decision making (see chart for details).    Patient presented by [facility] voluntarily with suicidal ideations. Blood work is  remarkable for an alcohol level 178. Patient withstood drinking beers today. She denies daily alcohol use. She denies history of complicated withdrawal from alcohol such as seizures. Urine drug screen is positive for cocaine and benzodiazepines.  She is medically clear for Behavioral Health disposition.  Behavioral health evaluated the patient and recommends evaluation by psychiatry in the morning. Psychiatric holding orders placed. I did not reorder her home medications, as she tells me she has not taken them in more than a month.   Patient agrees with plan to stay overnight.   Final Clinical Impressions(s) / ED Diagnoses   Final diagnoses:  Suicidal ideation  Alcohol abuse    New Prescriptions New Prescriptions   No medications on file     Lorene Dy 07/02/16 2200    Benjiman Core, MD 07/02/16 2230

## 2016-07-02 NOTE — ED Triage Notes (Signed)
Pt BIB [facility] for suicidal ideation with a plan. Per [facility], pt voluntary at this time and had plan to "cut herself," states pt going through breakup. Pt states she had 4 alcoholic drinks. Pt denies pain or other complaints; states she has hx of depression, bipolar, and schizophrenia, states she does not take any meds at this time. Charge RN aware; Security called; Comptrolleritter at bedside.

## 2016-07-02 NOTE — ED Notes (Signed)
Pt has eyebrow ring, nose ring, and tongue ring. Several attempts to have pt remove piercings. EDP made aware.

## 2016-07-03 ENCOUNTER — Encounter (HOSPITAL_COMMUNITY): Payer: Self-pay | Admitting: *Deleted

## 2016-07-03 ENCOUNTER — Inpatient Hospital Stay (HOSPITAL_COMMUNITY)
Admission: AD | Admit: 2016-07-03 | Discharge: 2016-07-07 | DRG: 885 | Disposition: A | Discharge status: Home / Self Care | Payer: No Typology Code available for payment source | Source: Intra-hospital | Attending: Psychiatry | Admitting: Psychiatry

## 2016-07-03 ENCOUNTER — Encounter (HOSPITAL_COMMUNITY): Payer: Self-pay

## 2016-07-03 DIAGNOSIS — L729 Follicular cyst of the skin and subcutaneous tissue, unspecified: Secondary | ICD-10-CM | POA: Diagnosis present

## 2016-07-03 DIAGNOSIS — F410 Panic disorder [episodic paroxysmal anxiety] without agoraphobia: Secondary | ICD-10-CM | POA: Diagnosis present

## 2016-07-03 DIAGNOSIS — L089 Local infection of the skin and subcutaneous tissue, unspecified: Secondary | ICD-10-CM | POA: Diagnosis present

## 2016-07-03 DIAGNOSIS — F17210 Nicotine dependence, cigarettes, uncomplicated: Secondary | ICD-10-CM | POA: Diagnosis present

## 2016-07-03 DIAGNOSIS — G4700 Insomnia, unspecified: Secondary | ICD-10-CM | POA: Diagnosis present

## 2016-07-03 DIAGNOSIS — F1020 Alcohol dependence, uncomplicated: Secondary | ICD-10-CM | POA: Diagnosis present

## 2016-07-03 DIAGNOSIS — J45909 Unspecified asthma, uncomplicated: Secondary | ICD-10-CM | POA: Diagnosis present

## 2016-07-03 DIAGNOSIS — Z6841 Body Mass Index (BMI) 40.0 and over, adult: Secondary | ICD-10-CM | POA: Diagnosis not present

## 2016-07-03 DIAGNOSIS — F411 Generalized anxiety disorder: Secondary | ICD-10-CM | POA: Diagnosis present

## 2016-07-03 DIAGNOSIS — Z91410 Personal history of adult physical and sexual abuse: Secondary | ICD-10-CM

## 2016-07-03 DIAGNOSIS — Z811 Family history of alcohol abuse and dependence: Secondary | ICD-10-CM | POA: Diagnosis not present

## 2016-07-03 DIAGNOSIS — F4310 Post-traumatic stress disorder, unspecified: Secondary | ICD-10-CM | POA: Diagnosis present

## 2016-07-03 DIAGNOSIS — Z818 Family history of other mental and behavioral disorders: Secondary | ICD-10-CM

## 2016-07-03 DIAGNOSIS — R45851 Suicidal ideations: Secondary | ICD-10-CM | POA: Diagnosis present

## 2016-07-03 DIAGNOSIS — Y906 Blood alcohol level of 120-199 mg/100 ml: Secondary | ICD-10-CM | POA: Diagnosis present

## 2016-07-03 DIAGNOSIS — F259 Schizoaffective disorder, unspecified: Secondary | ICD-10-CM | POA: Diagnosis present

## 2016-07-03 DIAGNOSIS — F17200 Nicotine dependence, unspecified, uncomplicated: Secondary | ICD-10-CM | POA: Diagnosis present

## 2016-07-03 DIAGNOSIS — F250 Schizoaffective disorder, bipolar type: Principal | ICD-10-CM | POA: Diagnosis present

## 2016-07-03 MED ORDER — TRAZODONE HCL 50 MG PO TABS
50.0000 mg | ORAL_TABLET | Freq: Every evening | ORAL | Status: DC | PRN
  Administered 2016-07-03 – 2016-07-06 (×3): 50 mg via ORAL
  Filled 2016-07-03 (×2): qty 1
  Filled 2016-07-03: qty 7

## 2016-07-03 MED ORDER — ACETAMINOPHEN 325 MG PO TABS: 650.0000 mg | ORAL_TABLET | Freq: Four times a day (QID) | ORAL | Status: DC | PRN

## 2016-07-03 MED ORDER — IBUPROFEN 600 MG PO TABS: 600.0000 mg | ORAL_TABLET | Freq: Three times a day (TID) | ORAL | Status: DC | PRN

## 2016-07-03 MED ORDER — ONDANSETRON HCL 4 MG PO TABS: 4.0000 mg | ORAL_TABLET | Freq: Three times a day (TID) | ORAL | Status: DC | PRN

## 2016-07-03 MED ORDER — MAGNESIUM HYDROXIDE 400 MG/5ML PO SUSP
30.0000 mL | Freq: Every day | ORAL | Status: DC | PRN
Start: 2016-07-03 — End: 2016-07-07

## 2016-07-03 MED ORDER — ALUM & MAG HYDROXIDE-SIMETH 200-200-20 MG/5ML PO SUSP: 30.0000 mL | ORAL | Status: DC | PRN

## 2016-07-03 MED ORDER — NICOTINE 14 MG/24HR TD PT24
14.0000 mg | MEDICATED_PATCH | Freq: Every day | TRANSDERMAL | Status: DC
  Administered 2016-07-03: 14 mg via TRANSDERMAL
  Filled 2016-07-03: qty 1

## 2016-07-03 MED ORDER — LORAZEPAM 1 MG PO TABS
1.0000 mg | ORAL_TABLET | Freq: Three times a day (TID) | ORAL | Status: DC | PRN
  Administered 2016-07-03: 1 mg via ORAL
  Filled 2016-07-03: qty 1

## 2016-07-03 MED ORDER — NICOTINE 14 MG/24HR TD PT24
14.0000 mg | MEDICATED_PATCH | Freq: Every day | TRANSDERMAL | Status: DC
  Administered 2016-07-04 – 2016-07-07 (×4): 14 mg via TRANSDERMAL
  Filled 2016-07-03 (×7): qty 1

## 2016-07-03 MED ORDER — OLANZAPINE 5 MG PO TBDP
5.0000 mg | ORAL_TABLET | Freq: Three times a day (TID) | ORAL | Status: DC | PRN
  Administered 2016-07-03: 5 mg via ORAL
  Filled 2016-07-03: qty 1

## 2016-07-03 MED ORDER — HALOPERIDOL 5 MG PO TABS
5.0000 mg | ORAL_TABLET | Freq: Once | ORAL | Status: AC
  Administered 2016-07-04: 5 mg via ORAL
  Filled 2016-07-03 (×2): qty 1

## 2016-07-03 NOTE — Progress Notes (Signed)
The patient attended the evening Wrap-Up group but refused to share.

## 2016-07-03 NOTE — Tx Team (Signed)
Initial Treatment Plan 07/03/2016 6:36 PM Carol NovemberStaci Morris ZOX:[id number]    PATIENT STRESSORS: Financial difficulties Substance abuse Traumatic event Other: "Life in general"   PATIENT STRENGTHS: Capable of independent living Wellsite geologistCommunication skills General fund of knowledge Motivation for treatment/growth   PATIENT IDENTIFIED PROBLEMS: Depression  Suicidal ideation  A/V hallucinations  "I want to be able to handle day to day normal stress without having a break down"  "Be no so depressed"             DISCHARGE CRITERIA:  Improved stabilization in mood, thinking, and/or behavior Motivation to continue treatment in a less acute level of care Verbal commitment to aftercare and medication compliance  PRELIMINARY DISCHARGE PLAN: Outpatient therapy Medication management   PATIENT/FAMILY INVOLVEMENT: This treatment plan has been presented to and reviewed with the patient, Carol NovemberStaci Morris.  The patient and family have been given the opportunity to ask questions and make suggestions.  Levin BaconHeather V Swathi Dauphin, RN 07/03/2016, 6:36 PM

## 2016-07-03 NOTE — Progress Notes (Signed)
Geanie BerlinStaci is a 36 year old female being admitted voluntarily to [facility] from [facility].  She came to the ED with suicidal ideation with plan to cut herself.  She has history of inpatient hospitalizations for Bipolar disorder and schizophrenia.  She does admit to using alcohol daily.  She reported command hallucinations that tell her to hurt herself.  She continues to voice suicidal ideation with no plan and does contract for safety on the unit.  She voices having continued pain that is generalized.  She reported history or slight scoliosis, asthma and COPD.  She does report that she has recently started having flash back and nightmares.  Oriented her to the unit.  Admission paperwork completed and signed.  Belongings searched and secured in locker # 13.  Skin assessment completed and noted tattoos back of each shoulder, right ankle and two on her lower left leg.  Q 15 minute checks initiated for safety.  We will monitor the progress towards her goals.

## 2016-07-03 NOTE — ED Notes (Signed)
Pt states she is hearing voices again and is requesting some medicine for anxiety.

## 2016-07-03 NOTE — ED Notes (Signed)
Pt states she has changed her mind she wants to stay and is having suicidal thoughts

## 2016-07-03 NOTE — ED Notes (Signed)
Notified [facility] for transport. 

## 2016-07-03 NOTE — ED Notes (Signed)
Per Shanda BumpsJessica RN pt breakfast tray ordered.

## 2016-07-03 NOTE — Progress Notes (Signed)
Patient ID: Daryel NovemberStaci Mooradian, female   DOB: 06/18/1980, 36 y.o.   MRN: [id number]  Pt currently presents with a flat affect and restless behavior. Pt seen pacing in the hallways tonight. Pt reports to writer that their goal is to "get rid of these hallucinations" Pt states "I can't sleep and have anxiety because of them." Pt requests medications that are "different or stronger" to alleviate psychosis symptoms. Pt reports poor sleep with current medication regimen.   Pt provided with medications per providers orders. Pt's labs and vitals were monitored throughout the night. Pt given a 1:1 about emotional and mental status. Pt supported and encouraged to express concerns and questions. Pt educated on medications and psychosis. Pt approached Clinical research associatewriter after initial nighttime medication pass requesting further aid. Provider notified, order for one time Haldol placed. Writer returned to see patient and she was lying on the bed with her eyes closed and did not respond to her name being called.   Pt's safety ensured with 15 minute and environmental checks. Pt currently denies SI/HI and A/V hallucinations. Pt verbally agrees to seek staff if SI/HI or A/VH occurs and to consult with staff before acting on any harmful thoughts. Will continue POC.

## 2016-07-04 ENCOUNTER — Encounter (HOSPITAL_COMMUNITY): Payer: Self-pay | Admitting: Psychiatry

## 2016-07-04 DIAGNOSIS — Z818 Family history of other mental and behavioral disorders: Secondary | ICD-10-CM

## 2016-07-04 DIAGNOSIS — Z811 Family history of alcohol abuse and dependence: Secondary | ICD-10-CM

## 2016-07-04 DIAGNOSIS — F4310 Post-traumatic stress disorder, unspecified: Secondary | ICD-10-CM

## 2016-07-04 DIAGNOSIS — F17210 Nicotine dependence, cigarettes, uncomplicated: Secondary | ICD-10-CM

## 2016-07-04 DIAGNOSIS — Z813 Family history of other psychoactive substance abuse and dependence: Secondary | ICD-10-CM

## 2016-07-04 DIAGNOSIS — R45851 Suicidal ideations: Secondary | ICD-10-CM

## 2016-07-04 DIAGNOSIS — F250 Schizoaffective disorder, bipolar type: Principal | ICD-10-CM

## 2016-07-04 LAB — URINALYSIS, COMPLETE (UACMP) WITH MICROSCOPIC
BILIRUBIN URINE: NEGATIVE
Glucose, UA: NEGATIVE mg/dL
Hgb urine dipstick: NEGATIVE
Ketones, ur: NEGATIVE mg/dL
Nitrite: NEGATIVE
PH: 5 (ref 5.0–8.0)
Protein, ur: NEGATIVE mg/dL
SPECIFIC GRAVITY, URINE: 1.012 (ref 1.005–1.030)

## 2016-07-04 MED ORDER — CHLORDIAZEPOXIDE HCL 25 MG PO CAPS
25.0000 mg | ORAL_CAPSULE | Freq: Every day | ORAL | Status: AC
  Administered 2016-07-07: 25 mg via ORAL
  Filled 2016-07-04: qty 1

## 2016-07-04 MED ORDER — ADULT MULTIVITAMIN W/MINERALS CH
1.0000 | ORAL_TABLET | Freq: Every day | ORAL | Status: DC
  Administered 2016-07-04 – 2016-07-07 (×4): 1 via ORAL
  Filled 2016-07-04 (×6): qty 1

## 2016-07-04 MED ORDER — LITHIUM CARBONATE ER 300 MG PO TBCR
300.0000 mg | EXTENDED_RELEASE_TABLET | Freq: Two times a day (BID) | ORAL | Status: DC
  Administered 2016-07-04 – 2016-07-07 (×6): 300 mg via ORAL
  Filled 2016-07-04: qty 1
  Filled 2016-07-04 (×2): qty 14
  Filled 2016-07-04 (×7): qty 1

## 2016-07-04 MED ORDER — BENZTROPINE MESYLATE 0.5 MG PO TABS
0.5000 mg | ORAL_TABLET | Freq: Every day | ORAL | Status: DC
  Administered 2016-07-04 – 2016-07-06 (×3): 0.5 mg via ORAL
  Filled 2016-07-04 (×5): qty 1

## 2016-07-04 MED ORDER — CHLORDIAZEPOXIDE HCL 25 MG PO CAPS
25.0000 mg | ORAL_CAPSULE | Freq: Four times a day (QID) | ORAL | Status: AC
  Administered 2016-07-04 (×3): 25 mg via ORAL
  Filled 2016-07-04 (×3): qty 1

## 2016-07-04 MED ORDER — CHLORDIAZEPOXIDE HCL 25 MG PO CAPS: 25.0000 mg | ORAL_CAPSULE | Freq: Four times a day (QID) | ORAL | Status: AC | PRN

## 2016-07-04 MED ORDER — LOPERAMIDE HCL 2 MG PO CAPS: 2.0000 mg | ORAL_CAPSULE | ORAL | Status: AC | PRN

## 2016-07-04 MED ORDER — OLANZAPINE 5 MG PO TBDP
5.0000 mg | ORAL_TABLET | Freq: Three times a day (TID) | ORAL | Status: DC | PRN
  Administered 2016-07-04: 5 mg via ORAL
  Filled 2016-07-04: qty 1

## 2016-07-04 MED ORDER — VITAMIN B-1 100 MG PO TABS
100.0000 mg | ORAL_TABLET | Freq: Every day | ORAL | Status: DC
  Administered 2016-07-05 – 2016-07-07 (×3): 100 mg via ORAL
  Filled 2016-07-04 (×7): qty 1

## 2016-07-04 MED ORDER — GABAPENTIN 100 MG PO CAPS
100.0000 mg | ORAL_CAPSULE | Freq: Three times a day (TID) | ORAL | Status: DC
  Administered 2016-07-04 – 2016-07-07 (×10): 100 mg via ORAL
  Filled 2016-07-04 (×4): qty 1
  Filled 2016-07-04: qty 21
  Filled 2016-07-04 (×6): qty 1
  Filled 2016-07-04: qty 21
  Filled 2016-07-04 (×2): qty 1
  Filled 2016-07-04: qty 21

## 2016-07-04 MED ORDER — FLUPHENAZINE HCL 5 MG PO TABS
5.0000 mg | ORAL_TABLET | Freq: Every day | ORAL | Status: DC
  Administered 2016-07-04 – 2016-07-05 (×2): 5 mg via ORAL
  Filled 2016-07-04 (×4): qty 1

## 2016-07-04 MED ORDER — CHLORDIAZEPOXIDE HCL 25 MG PO CAPS
25.0000 mg | ORAL_CAPSULE | Freq: Three times a day (TID) | ORAL | Status: AC
  Administered 2016-07-05 (×3): 25 mg via ORAL
  Filled 2016-07-04 (×3): qty 1

## 2016-07-04 MED ORDER — CHLORDIAZEPOXIDE HCL 25 MG PO CAPS
25.0000 mg | ORAL_CAPSULE | ORAL | Status: AC
  Administered 2016-07-06 (×2): 25 mg via ORAL
  Filled 2016-07-04 (×2): qty 1

## 2016-07-04 MED ORDER — THIAMINE HCL 100 MG/ML IJ SOLN: 100.0000 mg | Freq: Once | INTRAMUSCULAR | Status: DC

## 2016-07-04 MED ORDER — HYDROXYZINE HCL 25 MG PO TABS
25.0000 mg | ORAL_TABLET | Freq: Four times a day (QID) | ORAL | Status: DC | PRN
  Administered 2016-07-04: 25 mg via ORAL
  Filled 2016-07-04: qty 1
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
## 2016-07-04 NOTE — Progress Notes (Signed)
Recreation Therapy Notes  Date: 07/04/16 Time: 1000 Location: [address] Dayroom  Group Topic: Self-Esteem  Goal Area(s) Addresses:  Patient will identify positive ways to increase self-esteem. Patient will verbalize benefit of increased self-esteem.  Behavioral Response: Engaged  Intervention: Scientist, clinical (histocompatibility and immunogenetics)Construction paper, markers  Activity: Personalized Plates.  Patients were to design personalized license plates that highlights their uniqueness.  Patients could show important dates, important events, things that are special to them or hidden talents they may have.  Education:  Self-Esteem, Building control surveyorDischarge Planning.   Education Outcome: Acknowledges education/In group clarification offered/Needs additional education  Clinical Observations/Feedback: Pt stated she is a mother of 3, plays video games, recently became engaged, likes cats, likes nature, practices Burnis Medin[place]icken, likes to read, likes the beach and was born [place].  Pt stated the activity was easy because "I has been weeding out the things that aren't good for me".  Pt stated focusing on her positive traits will "help me be happy, normal, enjoy life and not cry so much".   Caroll RancherMarjette Santonio Speakman, LRT/CTRS       Lillia AbedLindsay, Evalin Shawhan A 07/04/2016 11:40 AM
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
## 2016-07-04 NOTE — Plan of Care (Signed)
Problem: Education: Goal: Mental status will improve Outcome: Not Progressing Pt continues to report hallucinations and si thoughts. New medications started today.

## 2016-07-04 NOTE — Progress Notes (Signed)
Recreation Therapy Notes  Animal-Assisted Activity (AAA) Program Checklist/Progress Notes Patient Eligibility Criteria Checklist & Daily Group note for Rec TxIntervention  Date: 05.08.2018 Time: 2:45pm Location: [address]   AAA/T Program Assumption of Risk Form signed by Patient/ or Parent Legal Guardian Yes  Patient is free of allergies or sever asthma Yes  Patient reports no fear of animals Yes  Patient reports no history of cruelty to animals Yes  Patient understands his/her participation is voluntary Yes  Patient washes hands before animal contact Yes  Patient washes hands after animal contact Yes  Behavioral Response: Engaged, Attentive   Education:Hand Washing, Appropriate Animal Interaction   Education Outcome: Acknowledges education.   Clinical Observations/Feedback: Patient attended session and interacted appropriately with therapy dog and peers.   Marykay Lexenise L Curties Conigliaro, LRT/CTRS       Mckyla Deckman L 07/04/2016 3:02 PM

## 2016-07-04 NOTE — Progress Notes (Signed)
D:Pt reports that she hears voices and sees people with no faces. She reports suicidal thoughts and has been isolative in the morning. A:Offered support, encouragement and 15 minute checks. R:Pt contracts with staff for safety. Safety maintained on the unit.
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
## 2016-07-04 NOTE — [facility] Group Notes (Signed)
[facility] LCSW Group Therapy  07/04/2016 , 12:55 PM   Type of Therapy:  Group Therapy  Participation Level:  Active  Participation Quality:  Attentive  Affect:  Appropriate  Cognitive:  Alert  Insight:  Improving  Engagement in Therapy:  Engaged  Modes of Intervention:  Discussion, Exploration and Socialization  Summary of Progress/Problems: Today's group focused on the term Diagnosis.  Participants were asked to define the term, and then pronounce whether it is a negative, positive or neutral term. Stayed the entire time, engaged throughout.  Talked about previous experience with [employer] and how she has gotten support from them in the past.  Others were curious about this, and she was glad to give them more information.  Also identified her fiance as a main source of support.  Daryel Geraldorth, Nandan Willems B 07/04/2016 , 12:55 PM

## 2016-07-04 NOTE — [facility] Group Notes (Signed)
The focus of this group is to educate the patient on the purpose and policies of crisis stabilization and provide a format to answer questions about their admission.  The group details unit policies and expectations of patients while admitted.  Patient did not attend 0900 nurse education orientation group this morning.  Patient stayed in room.  

## 2016-07-04 NOTE — Progress Notes (Signed)
Patient ID: Daryel NovemberStaci Morris, female   DOB: 07/06/1980, 36 y.o.   MRN: [id number] D: Client in bed this shift, reports goal "to do better today, to feel better" depression "4" of 10. A: Writer provided emotional support, encouraged client to get out of bed. Medications reviewed, administered as ordered. Staff will monitor q6315min for safety. R: Client is safe on the unit, did not attend group.

## 2016-07-05 LAB — LIPID PANEL
CHOLESTEROL: 182 mg/dL (ref 0–200)
HDL: 51 mg/dL (ref >40)
LDL Cholesterol: 97 mg/dL (ref 0–99)
Total CHOL/HDL Ratio: 3.6 RATIO
Triglycerides: 172 mg/dL — ABNORMAL HIGH (ref <150)
VLDL: 34 mg/dL (ref 0–40)

## 2016-07-05 LAB — TSH: TSH: 1.593 u[IU]/mL (ref 0.350–4.500)

## 2016-07-05 MED ORDER — FLUPHENAZINE HCL 5 MG PO TABS
5.0000 mg | ORAL_TABLET | ORAL | Status: DC
  Administered 2016-07-05 – 2016-07-07 (×4): 5 mg via ORAL
  Filled 2016-07-05 (×3): qty 1
  Filled 2016-07-05: qty 14
  Filled 2016-07-05: qty 1
  Filled 2016-07-05: qty 14
  Filled 2016-07-05 (×2): qty 1

## 2016-07-05 MED ORDER — DIPHENHYDRAMINE HCL 50 MG/ML IJ SOLN: 25.0000 mg | Freq: Four times a day (QID) | INTRAMUSCULAR | Status: DC | PRN

## 2016-07-05 MED ORDER — DIPHENHYDRAMINE HCL 25 MG PO CAPS
25.0000 mg | ORAL_CAPSULE | Freq: Four times a day (QID) | ORAL | Status: DC | PRN
  Administered 2016-07-05 – 2016-07-06 (×4): 25 mg via ORAL
  Filled 2016-07-05 (×4): qty 1

## 2016-07-05 MED ORDER — HALOPERIDOL LACTATE 5 MG/ML IJ SOLN: 5.0000 mg | Freq: Four times a day (QID) | INTRAMUSCULAR | Status: DC | PRN

## 2016-07-05 MED ORDER — OLANZAPINE 10 MG PO TBDP
10.0000 mg | ORAL_TABLET | Freq: Once | ORAL | Status: AC
  Administered 2016-07-05: 10 mg via ORAL
  Filled 2016-07-05 (×2): qty 1

## 2016-07-05 MED ORDER — BENZTROPINE MESYLATE 0.5 MG PO TABS
0.5000 mg | ORAL_TABLET | ORAL | Status: DC
  Administered 2016-07-06 – 2016-07-07 (×2): 0.5 mg via ORAL
  Filled 2016-07-05 (×3): qty 1
  Filled 2016-07-05: qty 14
  Filled 2016-07-05 (×2): qty 1
  Filled 2016-07-05: qty 14
  Filled 2016-07-05: qty 1

## 2016-07-05 MED ORDER — HALOPERIDOL 5 MG PO TABS
5.0000 mg | ORAL_TABLET | Freq: Four times a day (QID) | ORAL | Status: DC | PRN
  Administered 2016-07-05 – 2016-07-06 (×3): 5 mg via ORAL
  Filled 2016-07-05 (×4): qty 1

## 2016-07-05 NOTE — Plan of Care (Signed)
Problem: Safety: Goal: Periods of time without injury will increase Outcome: Progressing Pt. remains a low fall risk at this time, denies SI/HI/AVH, Q 15 checks in effect.

## 2016-07-05 NOTE — Tx Team (Signed)
Interdisciplinary Treatment and Diagnostic Plan Update  07/05/2016 Time of Session: 10:21 AM  [name] MRN: [id number]  Principal Diagnosis: Schizoaffective disorder, bipolar type ([place])  Secondary Diagnoses: Principal Problem:   Schizoaffective disorder, bipolar type ([place]) Active Problems:   PTSD (post-traumatic stress disorder)   Alcohol use disorder, moderate, dependence ([place])   Tobacco use disorder   Current Medications:  Current Facility-Administered Medications  Medication Dose Route Frequency Provider Last Rate Last Dose  . acetaminophen (TYLENOL) tablet 650 mg  650 mg Oral Q6H PRN Niel Hummer, NP      . alum & mag hydroxide-simeth (MAALOX/MYLANTA) 200-200-20 MG/5ML suspension 30 mL  30 mL Oral Q4H PRN Elmarie Shiley A, NP      . benztropine (COGENTIN) tablet 0.5 mg  0.5 mg Oral QHS Eappen, Ria Clock, MD   0.5 mg at 07/04/16 2221  . chlordiazePOXIDE (LIBRIUM) capsule 25 mg  25 mg Oral Q6H PRN Eappen, Saramma, MD      . chlordiazePOXIDE (LIBRIUM) capsule 25 mg  25 mg Oral TID Ursula Alert, MD   25 mg at 07/05/16 0803   Followed by  . [START ON 07/06/2016] chlordiazePOXIDE (LIBRIUM) capsule 25 mg  25 mg Oral BH-qamhs Ursula Alert, MD       Followed by  . [START ON 07/07/2016] chlordiazePOXIDE (LIBRIUM) capsule 25 mg  25 mg Oral Daily Eappen, Saramma, MD      . fluPHENAZine (PROLIXIN) tablet 5 mg  5 mg Oral Daily Eappen, Saramma, MD   5 mg at 07/05/16 0803  . gabapentin (NEURONTIN) capsule 100 mg  100 mg Oral TID Ursula Alert, MD   100 mg at 07/05/16 0803  . hydrOXYzine (ATARAX/VISTARIL) tablet 25 mg  25 mg Oral Q6H PRN Ursula Alert, MD   25 mg at 07/04/16 1356  . ibuprofen (ADVIL,MOTRIN) tablet 600 mg  600 mg Oral Q8H PRN Elmarie Shiley A, NP      . lithium carbonate (LITHOBID) CR tablet 300 mg  300 mg Oral BID WC Eappen, Saramma, MD   300 mg at 07/05/16 0803  . loperamide (IMODIUM) capsule 2-4 mg  2-4 mg Oral PRN Eappen, Saramma, MD      . magnesium hydroxide (MILK OF  MAGNESIA) suspension 30 mL  30 mL Oral Daily PRN Elmarie Shiley A, NP      . multivitamin with minerals tablet 1 tablet  1 tablet Oral Daily Ursula Alert, MD   1 tablet at 07/05/16 0802  . nicotine (NICODERM CQ - dosed in mg/24 hours) patch 14 mg  14 mg Transdermal Daily Elmarie Shiley A, NP   14 mg at 07/05/16 0804  . OLANZapine zydis (ZYPREXA) disintegrating tablet 10 mg  10 mg Oral Once Eappen, Ria Clock, MD      . OLANZapine zydis (ZYPREXA) disintegrating tablet 5 mg  5 mg Oral TID PRN Ursula Alert, MD   5 mg at 07/04/16 1455  . ondansetron (ZOFRAN) tablet 4 mg  4 mg Oral Q8H PRN Elmarie Shiley A, NP      . thiamine (B-1) injection 100 mg  100 mg Intramuscular Once Eappen, Saramma, MD      . thiamine (VITAMIN B-1) tablet 100 mg  100 mg Oral Daily Eappen, Saramma, MD   100 mg at 07/05/16 0805  . traZODone (DESYREL) tablet 50 mg  50 mg Oral QHS PRN Niel Hummer, NP   50 mg at 07/04/16 2221    PTA Medications: Prescriptions Prior to Admission  Medication Sig Dispense Refill Last Dose  .  busPIRone (BUSPAR) 5 MG tablet Take 1 tablet (5 mg total) by mouth 3 (three) times daily. (Patient not taking: Reported on 07/02/2016) 90 tablet 0 Not Taking at Unknown time  . chlorproMAZINE (THORAZINE) 25 MG tablet Take 1 tablet (25 mg total) by mouth daily after lunch. (Patient not taking: Reported on 07/02/2016) 30 tablet 0 Not Taking at Unknown time  . chlorproMAZINE (THORAZINE) 50 MG tablet Take 3.5 tablets (175 mg total) by mouth at bedtime. (Patient not taking: Reported on 07/02/2016) 105 tablet 0 Not Taking at Unknown time  . hydrOXYzine (ATARAX/VISTARIL) 25 MG tablet Take 1 tablet (25 mg total) by mouth every 6 (six) hours as needed for anxiety. (Patient not taking: Reported on 07/02/2016) 30 tablet 0 Not Taking at Unknown time  . nicotine (NICODERM CQ - DOSED IN MG/24 HOURS) 21 mg/24hr patch Place 1 patch (21 mg total) onto the skin daily. (Patient not taking: Reported on 07/02/2016) 28 patch 0 Not Taking  .  sertraline (ZOLOFT) 100 MG tablet Take 1 tablet (100 mg total) by mouth daily. (Patient not taking: Reported on 07/02/2016) 30 tablet 0 Not Taking at Unknown time  . traZODone (DESYREL) 100 MG tablet Take 1 tablet (100 mg total) by mouth at bedtime as needed for sleep. (Patient not taking: Reported on 07/02/2016) 30 tablet 0 Not Taking at Unknown time    Treatment Modalities: Medication Management, Group therapy, Case management,  1 to 1 session with clinician, Psychoeducation, Recreational therapy.   Physician Treatment Plan for Primary Diagnosis: Schizoaffective disorder, bipolar type ([place]) Long Term Goal(s): Improvement in symptoms so as ready for discharge  Short Term Goals: Ability to identify changes in lifestyle to reduce recurrence of condition will improve Compliance with prescribed medications will improve Ability to identify changes in lifestyle to reduce recurrence of condition will improve Compliance with prescribed medications will improve Ability to identify triggers associated with substance abuse/mental health issues will improve  Medication Management: Evaluate patient's response, side effects, and tolerance of medication regimen.  Therapeutic Interventions: 1 to 1 sessions, Unit Group sessions and Medication administration.  Evaluation of Outcomes: Progressing  Physician Treatment Plan for Secondary Diagnosis: Principal Problem:   Schizoaffective disorder, bipolar type ([place]) Active Problems:   PTSD (post-traumatic stress disorder)   Alcohol use disorder, moderate, dependence ([place])   Tobacco use disorder   Long Term Goal(s): Improvement in symptoms so as ready for discharge  Short Term Goals: Ability to identify changes in lifestyle to reduce recurrence of condition will improve Compliance with prescribed medications will improve Ability to identify changes in lifestyle to reduce recurrence of condition will improve Compliance with prescribed medications will  improve Ability to identify triggers associated with substance abuse/mental health issues will improve  Medication Management: Evaluate patient's response, side effects, and tolerance of medication regimen.  Therapeutic Interventions: 1 to 1 sessions, Unit Group sessions and Medication administration.  Evaluation of Outcomes: Progressing   RN Treatment Plan for Primary Diagnosis: Schizoaffective disorder, bipolar type ([place]) Long Term Goal(s): Knowledge of disease and therapeutic regimen to maintain health will improve  Short Term Goals: Ability to identify and develop effective coping behaviors will improve and Compliance with prescribed medications will improve  Medication Management: RN will administer medications as ordered by provider, will assess and evaluate patient's response and provide education to patient for prescribed medication. RN will report any adverse and/or side effects to prescribing provider.  Therapeutic Interventions: 1 on 1 counseling sessions, Psychoeducation, Medication administration, Evaluate responses to treatment, Monitor vital signs and  CBGs as ordered, Perform/monitor CIWA, COWS, AIMS and Fall Risk screenings as ordered, Perform wound care treatments as ordered.  Evaluation of Outcomes: Progressing   LCSW Treatment Plan for Primary Diagnosis: Schizoaffective disorder, bipolar type ([place]) Long Term Goal(s): Safe transition to appropriate next level of care at discharge, Engage patient in therapeutic group addressing interpersonal concerns.  Short Term Goals: Engage patient in aftercare planning with referrals and resources  Therapeutic Interventions: Assess for all discharge needs, 1 to 1 time with Social worker, Explore available resources and support systems, Assess for adequacy in community support network, Educate family and significant other(s) on suicide prevention, Complete Psychosocial Assessment, Interpersonal group therapy.  Evaluation of Outcomes:  Met  Return home, follow up [facility]   Progress in Treatment: Attending groups: Yes Participating in groups: Yes Taking medication as prescribed: Yes Toleration medication: Yes, no side effects reported at this time Family/Significant other contact made: No Patient understands diagnosis: Yes AEB asking for help with getting back on meds Discussing patient identified problems/goals with staff: Yes Medical problems stabilized or resolved: Yes Denies suicidal/homicidal ideation: Yes Issues/concerns per patient self-inventory: None Other: N/A  New problem(s) identified: None identified at this time.   New Short Term/Long Term Goal(s): None identified at this time.   Discharge Plan or Barriers:   Reason for Continuation of Hospitalization: Depression Hallucinations  Medication stabilization Suicidal ideation Withdrawal symptoms  Estimated Length of Stay: 3-5 days  Attendees: Patient: 07/05/2016  10:21 AM  Physician: Ursula Alert, MD 07/05/2016  10:21 AM  Nursing: Eulogio Bear RN 07/05/2016  10:21 AM  RN Care Manager: Lars Pinks, RN 07/05/2016  10:21 AM  Social Worker: Ripley Fraise 07/05/2016  10:21 AM  Recreational Therapist: Laretta Bolster  07/05/2016  10:21 AM  Other: Norberto Sorenson 07/05/2016  10:21 AM  Other:  07/05/2016  10:21 AM    Scribe for Treatment Team:  Roque Lias LCSW 07/05/2016 10:21 AM

## 2016-07-05 NOTE — [facility] Group Notes (Signed)
[facility] Group Notes:  (Counselor/Nursing/MHT/Case Management/Adjunct)  07/05/2016 1:15PM  Type of Therapy:  Group Therapy  Participation Level:  Active  Participation Quality:  Appropriate  Affect:  Flat  Cognitive:  Oriented  Insight:  Improving  Engagement in Group:  Limited  Engagement in Therapy:  Limited  Modes of Intervention:  Discussion, Exploration and Socialization  Summary of Progress/Problems: The topic for group was balance in life.  Pt participated in the discussion about when their life was in balance and out of balance and how this feels.  Pt discussed ways to get back in balance and short term goals they can work on to get where they want to be. Present for about half of the group.  Shared that she is feeling resentful, hurt, scared and like she is regressing.  "My mother told me I could not come home.  She and my father always told me I was a mistake-and that's how I have always been treated."  Became tearful.  Given support by other group members.    Daryel Geraldorth, Navia Lindahl B 07/05/2016 3:27 PM

## 2016-07-05 NOTE — Progress Notes (Signed)
Observation note: Pt endorses command AH and SI. Pt was noted to be crying on approach and anxious. Per Dr. Elna BreslowEappen, give pt 10 mg of Zyprexa times one. Order placed. Waiting for pharmacy to verify med before administering med to pt. Pt made aware. Pt verbalized to writer that after she takes her med then maybe she can contract for safety. Pt was encouraged by sitter to Archivistinform writer. Pt remains on 1:1 observation for safety.

## 2016-07-05 NOTE — Progress Notes (Signed)
11[facility] Post 1:1 Observation Documentation  For the first (8) hours following discontinuation of 1:1 precautions, a progress note entry by nursing staff should be documented at least every 2 hours, reflecting the patient's behavior, condition, mood, and conversation.  Use the progress notes for additional entries.  Time 1:1 discontinued:  11:45 p  Patient's Behavior:  Pt asleep.  Patient's Condition:  Safety maintained.   Patient's Conversation:  Marigene Ehlersn/a  Batool Majid L 07/05/2016, 4:02 PM

## 2016-07-05 NOTE — Progress Notes (Signed)
[facility] Post 1:1 Observation Documentation  For the first (8) hours following discontinuation of 1:1 precautions, a progress note entry by nursing staff should be documented at least every 2 hours, reflecting the patient's behavior, condition, mood, and conversation.  Use the progress notes for additional entries.  Time 1:1 discontinued:  11:45 am  Patient's Behavior:  Pt observed participating in group.   Patient's Condition:  Safety maintained. 15 minute checks performed for safety.  Patient's Conversation:  Soft/slow and logical/coherent   Whittney Steenson L 07/05/2016, 1:48 PM

## 2016-07-05 NOTE — Progress Notes (Signed)
Observation  Note: Pt placed on 1:1 observation for suicide precautions. Pt verbalized to writer thoughts of wanting to commit suicide because she no longer wants to live. Pt stated that her mother refuses to allow her to live with her and that she's tired of being homeless and doesn't want to [place]. "I would rather die than be homeless". Writer made Dr. Elna BreslowEappen aware. Order given to place pt on 1:1 observation. Pt will remain on 1:1 observation until d/c'd.
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
## 2016-07-05 NOTE — Progress Notes (Signed)
Recreation Therapy Notes  Date: 07/05/16 Time: 1000 Location: 300 Hall Dayroom  Group Topic: Leisure Education, Goal Setting  Goal Area(s) Addresses:  Patient will be able to identify at least 3 life goals.  Patient will be able to identify benefit of investing in life goals.  Patient will be able to identify benefit of setting life goals.   Behavioral Response:  None  Intervention: Goals sheet, pencils  Activity: Life Goals.  Patients were to determine what they do well, where they need to improve and set a goal to make that improvement.  Patients had to set goals in the areas of family, friends, work/school, spirituality, body and mental health.  Education:  Discharge Planning, PharmacologistCoping Skills, Leisure Education   Education Outcome: Acknowledges Education/In Group Clarification Provided/Needs Additional Education  Clinical Observations:  Pt arrived late.  Pt sat quietly by the galley and listened.   Caroll RancherMarjette Aleeta Schmaltz, LRT/CTRS         Caroll RancherLindsay, Carolle Ishii A 07/05/2016 12:44 PM

## 2016-07-05 NOTE — Progress Notes (Signed)
[facility] Post 1:1 Observation Documentation  For the first (8) hours following discontinuation of 1:1 precautions, a progress note entry by nursing staff should be documented at least every 2 hours, reflecting the patient's behavior, condition, mood, and conversation.  Use the progress notes for additional entries.  Time 1:1 discontinued:  11:45am  Patient's Behavior:  Pt calm and cooperative. No active SI  Patient's Condition:  Safety maintained. 15 minute checks performed for safety.  Patient's Conversation:  Soft, slow speech  Shamir Tuzzolino, Chrystine Oileratrice L 07/05/2016, 5:51 PM

## 2016-07-05 NOTE — Progress Notes (Signed)
[facility] Post 1:1 Observation Documentation  For the first (8) hours following discontinuation of 1:1 precautions, a progress note entry by nursing staff should be documented at least every 2 hours, reflecting the patient's behavior, condition, mood, and conversation.  Use the progress notes for additional entries.  Time 1:1 discontinued:  11:45am  Patient's Behavior:  Pt. anxious but cooperative. Denies SI/HI.   Patient's Condition:  Q 15 minute checks in effect. Pt. remains safe at this time.   Patient's Conversation:  Soft, slow speech.

## 2016-07-05 NOTE — Progress Notes (Signed)
[facility] Post 1:1 Observation Documentation  For the first (8) hours following discontinuation of 1:1 precautions, a progress note entry by nursing staff should be documented at least every 2 hours, reflecting the patient's behavior, condition, mood, and conversation.  Use the progress notes for additional entries.  Time 1:1 discontinued:  11:45 am  Patient's Behavior:  Pt calm and cooperative. Pt verbally contracts for safety and denies SI.  Patient's Condition:  Pt safety maintained. 15 minute checks performed for safety.  Patient's Conversation:  Soft, slow speech. Logical/coherent   Alvan Culpepper L 07/05/2016, 11:45 AM

## 2016-07-06 DIAGNOSIS — F1290 Cannabis use, unspecified, uncomplicated: Secondary | ICD-10-CM

## 2016-07-06 DIAGNOSIS — F1020 Alcohol dependence, uncomplicated: Secondary | ICD-10-CM

## 2016-07-06 DIAGNOSIS — F1490 Cocaine use, unspecified, uncomplicated: Secondary | ICD-10-CM

## 2016-07-06 LAB — PROLACTIN: PROLACTIN: 45.7 ng/mL — AB (ref 4.8–23.3)

## 2016-07-06 LAB — HEMOGLOBIN A1C
Hgb A1c MFr Bld: 5.4 % (ref 4.8–5.6)
Mean Plasma Glucose: 108 mg/dL

## 2016-07-06 LAB — URINE CULTURE: Special Requests: NORMAL

## 2016-07-06 MED ORDER — SERTRALINE HCL 100 MG PO TABS
100.0000 mg | ORAL_TABLET | Freq: Every day | ORAL | Status: DC
  Administered 2016-07-06 – 2016-07-07 (×2): 100 mg via ORAL
  Filled 2016-07-06 (×2): qty 1
  Filled 2016-07-06: qty 7
  Filled 2016-07-06 (×2): qty 1

## 2016-07-06 MED ORDER — DOXYCYCLINE HYCLATE 100 MG PO TABS
100.0000 mg | ORAL_TABLET | Freq: Two times a day (BID) | ORAL | Status: DC
  Administered 2016-07-06 – 2016-07-07 (×2): 100 mg via ORAL
  Filled 2016-07-06 (×2): qty 1
  Filled 2016-07-06 (×2): qty 12
  Filled 2016-07-06 (×2): qty 1

## 2016-07-06 NOTE — Progress Notes (Signed)
Patient ID: Carol NovemberStaci Schreur, female   DOB: 07/22/1980, 36 y.o.   MRN: [id number] D: client visible on unit, seen in dayroom watching TV, interacting with peers. Client reports voices decreased, they not gone, but decreased and visions decreased" Client reports depression, anxiety "4" of 10. "I'm hoping I can go home tomorrow" "I wanted to get on my medicine and get hooked up with [facility]" "I think I'll do alright now" A: Writer provided emotional support, encouraged client to continue medications after discharged. Medications reviewed, administered as ordered. Staff will monitor q5415min for safety. R: Client is safe on the unit, attended group.

## 2016-07-06 NOTE — [facility] Group Notes (Signed)
[facility]  07/06/2016 , 12:58 PM    Type of Therapy:  [facility] [employer] Presentation  Participation Level:  Active  Participation Quality:  Attentive  Affect:  Blunted  Cognitive:  Oriented  Insight:  Limited  Engagement in Therapy:  Engaged  Modes of Intervention:  Discussion, Education and Socialization  Summary of Progress/Problems:  Onalee HuaDavid from [facility] [employer] came to present his recovery story and play the guitar.  Stayed the entire time, engaged throughout.  Daryel Geraldorth, Tiki Tucciarone B 07/06/2016 , 12:58 PM

## 2016-07-06 NOTE — Plan of Care (Signed)
Problem: Health Behavior/Discharge Planning: Goal: Compliance with treatment plan for underlying cause of condition will improve Outcome: Progressing Pt is taking medications as prescribed.

## 2016-07-06 NOTE — Tx Team (Signed)
Interdisciplinary Treatment and Diagnostic Plan Update  07/06/2016 Time of Session: 10:07 AM  [name] MRN: [id number]  Principal Diagnosis: Schizoaffective disorder, bipolar type ([place])  Secondary Diagnoses: Principal Problem:   Schizoaffective disorder, bipolar type ([place]) Active Problems:   PTSD (post-traumatic stress disorder)   Alcohol use disorder, moderate, dependence ([place])   Tobacco use disorder   Current Medications:  Current Facility-Administered Medications  Medication Dose Route Frequency Provider Last Rate Last Dose  . acetaminophen (TYLENOL) tablet 650 mg  650 mg Oral Q6H PRN Niel Hummer, NP      . alum & mag hydroxide-simeth (MAALOX/MYLANTA) 200-200-20 MG/5ML suspension 30 mL  30 mL Oral Q4H PRN Elmarie Shiley A, NP      . benztropine (COGENTIN) tablet 0.5 mg  0.5 mg Oral QHS Eappen, Ria Clock, MD   0.5 mg at 07/05/16 2119  . benztropine (COGENTIN) tablet 0.5 mg  0.5 mg Oral BH-qamhs Eappen, Saramma, MD   0.5 mg at 07/06/16 0832  . chlordiazePOXIDE (LIBRIUM) capsule 25 mg  25 mg Oral Q6H PRN Eappen, Saramma, MD      . chlordiazePOXIDE (LIBRIUM) capsule 25 mg  25 mg Oral BH-qamhs Eappen, Saramma, MD   25 mg at 07/06/16 0832   Followed by  . [START ON 07/07/2016] chlordiazePOXIDE (LIBRIUM) capsule 25 mg  25 mg Oral Daily Eappen, Saramma, MD      . diphenhydrAMINE (BENADRYL) capsule 25 mg  25 mg Oral Q6H PRN Eappen, Saramma, MD   25 mg at 07/05/16 2119   Or  . diphenhydrAMINE (BENADRYL) injection 25 mg  25 mg Intramuscular Q6H PRN Eappen, Saramma, MD      . fluPHENAZine (PROLIXIN) tablet 5 mg  5 mg Oral BH-qamhs Eappen, Saramma, MD   5 mg at 07/06/16 0832  . gabapentin (NEURONTIN) capsule 100 mg  100 mg Oral TID Ursula Alert, MD   100 mg at 07/06/16 8250  . haloperidol (HALDOL) tablet 5 mg  5 mg Oral Q6H PRN Ursula Alert, MD   5 mg at 07/05/16 2119   Or  . haloperidol lactate (HALDOL) injection 5 mg  5 mg Intramuscular Q6H PRN Eappen, Ria Clock, MD      . ibuprofen  (ADVIL,MOTRIN) tablet 600 mg  600 mg Oral Q8H PRN Elmarie Shiley A, NP      . lithium carbonate (LITHOBID) CR tablet 300 mg  300 mg Oral BID WC Eappen, Ria Clock, MD   300 mg at 07/06/16 0832  . loperamide (IMODIUM) capsule 2-4 mg  2-4 mg Oral PRN Eappen, Saramma, MD      . magnesium hydroxide (MILK OF MAGNESIA) suspension 30 mL  30 mL Oral Daily PRN Elmarie Shiley A, NP      . multivitamin with minerals tablet 1 tablet  1 tablet Oral Daily Ursula Alert, MD   1 tablet at 07/06/16 0832  . nicotine (NICODERM CQ - dosed in mg/24 hours) patch 14 mg  14 mg Transdermal Daily Elmarie Shiley A, NP   14 mg at 07/06/16 5397  . ondansetron (ZOFRAN) tablet 4 mg  4 mg Oral Q8H PRN Elmarie Shiley A, NP      . thiamine (B-1) injection 100 mg  100 mg Intramuscular Once Eappen, Saramma, MD      . thiamine (VITAMIN B-1) tablet 100 mg  100 mg Oral Daily Eappen, Ria Clock, MD   100 mg at 07/06/16 0832  . traZODone (DESYREL) tablet 50 mg  50 mg Oral QHS PRN Niel Hummer, NP   50 mg at  07/04/16 2221    PTA Medications: Prescriptions Prior to Admission  Medication Sig Dispense Refill Last Dose  . busPIRone (BUSPAR) 5 MG tablet Take 1 tablet (5 mg total) by mouth 3 (three) times daily. (Patient not taking: Reported on 07/02/2016) 90 tablet 0 Not Taking at Unknown time  . chlorproMAZINE (THORAZINE) 25 MG tablet Take 1 tablet (25 mg total) by mouth daily after lunch. (Patient not taking: Reported on 07/02/2016) 30 tablet 0 Not Taking at Unknown time  . chlorproMAZINE (THORAZINE) 50 MG tablet Take 3.5 tablets (175 mg total) by mouth at bedtime. (Patient not taking: Reported on 07/02/2016) 105 tablet 0 Not Taking at Unknown time  . hydrOXYzine (ATARAX/VISTARIL) 25 MG tablet Take 1 tablet (25 mg total) by mouth every 6 (six) hours as needed for anxiety. (Patient not taking: Reported on 07/02/2016) 30 tablet 0 Not Taking at Unknown time  . nicotine (NICODERM CQ - DOSED IN MG/24 HOURS) 21 mg/24hr patch Place 1 patch (21 mg total) onto the  skin daily. (Patient not taking: Reported on 07/02/2016) 28 patch 0 Not Taking  . sertraline (ZOLOFT) 100 MG tablet Take 1 tablet (100 mg total) by mouth daily. (Patient not taking: Reported on 07/02/2016) 30 tablet 0 Not Taking at Unknown time  . traZODone (DESYREL) 100 MG tablet Take 1 tablet (100 mg total) by mouth at bedtime as needed for sleep. (Patient not taking: Reported on 07/02/2016) 30 tablet 0 Not Taking at Unknown time    Treatment Modalities: Medication Management, Group therapy, Case management,  1 to 1 session with clinician, Psychoeducation, Recreational therapy.   Physician Treatment Plan for Primary Diagnosis: Schizoaffective disorder, bipolar type ([place]) Long Term Goal(s): Improvement in symptoms so as ready for discharge  Short Term Goals: Ability to identify changes in lifestyle to reduce recurrence of condition will improve Compliance with prescribed medications will improve Ability to identify changes in lifestyle to reduce recurrence of condition will improve Compliance with prescribed medications will improve Ability to identify triggers associated with substance abuse/mental health issues will improve  Medication Management: Evaluate patient's response, side effects, and tolerance of medication regimen.  Therapeutic Interventions: 1 to 1 sessions, Unit Group sessions and Medication administration.  Evaluation of Outcomes: Adequate for Discharge  Physician Treatment Plan for Secondary Diagnosis: Principal Problem:   Schizoaffective disorder, bipolar type ([place]) Active Problems:   PTSD (post-traumatic stress disorder)   Alcohol use disorder, moderate, dependence ([place])   Tobacco use disorder   Long Term Goal(s): Improvement in symptoms so as ready for discharge  Short Term Goals: Ability to identify changes in lifestyle to reduce recurrence of condition will improve Compliance with prescribed medications will improve Ability to identify changes in lifestyle to  reduce recurrence of condition will improve Compliance with prescribed medications will improve Ability to identify triggers associated with substance abuse/mental health issues will improve  Medication Management: Evaluate patient's response, side effects, and tolerance of medication regimen.  Therapeutic Interventions: 1 to 1 sessions, Unit Group sessions and Medication administration.  Evaluation of Outcomes: Adequate for Discharge   RN Treatment Plan for Primary Diagnosis: Schizoaffective disorder, bipolar type ([place]) Long Term Goal(s): Knowledge of disease and therapeutic regimen to maintain health will improve  Short Term Goals: Ability to identify and develop effective coping behaviors will improve and Compliance with prescribed medications will improve  Medication Management: RN will administer medications as ordered by provider, will assess and evaluate patient's response and provide education to patient for prescribed medication. RN will report any adverse and/or  side effects to prescribing provider.  Therapeutic Interventions: 1 on 1 counseling sessions, Psychoeducation, Medication administration, Evaluate responses to treatment, Monitor vital signs and CBGs as ordered, Perform/monitor CIWA, COWS, AIMS and Fall Risk screenings as ordered, Perform wound care treatments as ordered.  Evaluation of Outcomes: Adequate for Discharge   LCSW Treatment Plan for Primary Diagnosis: Schizoaffective disorder, bipolar type ([place]) Long Term Goal(s): Safe transition to appropriate next level of care at discharge, Engage patient in therapeutic group addressing interpersonal concerns.  Short Term Goals: Engage patient in aftercare planning with referrals and resources  Therapeutic Interventions: Assess for all discharge needs, 1 to 1 time with Social worker, Explore available resources and support systems, Assess for adequacy in community support network, Educate family and significant other(s) on  suicide prevention, Complete Psychosocial Assessment, Interpersonal group therapy.  Evaluation of Outcomes: Met  Return home, follow up [facility]   Progress in Treatment: Attending groups: Yes Participating in groups: Yes Taking medication as prescribed: Yes Toleration medication: Yes, no side effects reported at this time Family/Significant other contact made: No Patient understands diagnosis: Yes AEB asking for help with getting back on meds Discussing patient identified problems/goals with staff: Yes Medical problems stabilized or resolved: Yes Denies suicidal/homicidal ideation: Yes Issues/concerns per patient self-inventory: None Other: N/A  New problem(s) identified: None identified at this time.   New Short Term/Long Term Goal(s): None identified at this time.   Discharge Plan or Barriers:   Reason for Continuation of Hospitalization:  Estimated Length of Stay: [id number] d/c tomorrow  Attendees: Patient: 07/06/2016  10:07 AM  Physician: Marchelle Folks, MD 07/06/2016  10:07 AM  Nursing:Elizabeth Broadus John RN 07/06/2016  10:07 AM  RN Care Manager: Lars Pinks, RN 07/06/2016  10:07 AM  Social Worker: Ripley Fraise 07/06/2016  10:07 AM  Recreational Therapist: Marjette  07/06/2016  10:07 AM  Other: Norberto Sorenson 07/06/2016  10:07 AM  Other:  07/06/2016  10:07 AM    Scribe for Treatment Team:  Roque Lias LCSW 07/06/2016 10:07 AM

## 2016-07-06 NOTE — Progress Notes (Signed)
D:Pt reports that her voices are decreasing and her depression has increased today. She denies si and hi thoughts. Pt talked with her mother and became upset as she had called to get some of her clothes and her mother told her that she did not want to see her now. A:Offered support, encouragement and 15 minute checks.  R:Pt remains safe on the unit.
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
## 2016-07-06 NOTE — Progress Notes (Signed)
Recreation Therapy Notes  Date: 07/06/16 Time: 1000 Location: 300 Hall Dayroom  Group Topic: Coping Skills  Goal Area(s) Addresses:  Patient will be able to positive coping skills. Patient will be able to identify benefits of using positive coping skills. Patient will be able to benefits of using coping skills post d/c.  Behavioral Response: Engaged  Intervention: Mindmap, pencils  Activity: Mindmap.  Patients were given a blank mindmap.  Patients and LRT filled in the first eight boxes together.  Patients were to then identify three coping skills for each of the stressors identified by the patients and LRT.  Once patients identified their coping skills, LRT would fill in the coping skills on the board so patients could fill in any blank spaces they may have had on their sheets.  Education: PharmacologistCoping Skills, Building control surveyorDischarge Planning.   Education Outcome: Acknowledges understanding/In group clarification offered/Needs additional education.   Clinical Observations/Feedback: Pt stated using "coping skills will help prevent health problems".  Pt stated vacation/time off for work; showers/bubble baths for stress; focus on memories for mourning; compromise for relationships and medication for stress.   Caroll RancherMarjette Hamish Banks, LRT/CTRS         Caroll RancherLindsay, Sabryn Preslar A 07/06/2016 11:49 AM

## 2016-07-06 NOTE — Progress Notes (Signed)
  DATA ACTION RESPONSE  Objective- Pt. is visible in the dayroom, seen watching TV. Presents with an anxious/irritable affect and mood. Pt. appears somatically focused and preoccupied with obtaining meds. Pt. plan is to stay at a hotel after d/c.  Subjective- Denies having any SI/HI/AVH/Pain at this time. Pt. states " I am going to try to get a therapy dog when I get out".Continues to be cooperative and remain safe on the unit.  1:1 interaction in private to establish rapport. Encouragement, education, & support given from staff. Meds. ordered and administered. PRN Benadryl and Haldol requested and will re-eval accordingly.   Safety maintained with Q 15 checks. Continues to follow treatment plan and will monitor closely. No additonal questions/concerns noted.

## 2016-07-07 MED ORDER — LITHIUM CARBONATE ER 300 MG PO TBCR: 300.0000 mg | EXTENDED_RELEASE_TABLET | Freq: Two times a day (BID) | ORAL | 0 refills | Status: AC

## 2016-07-07 MED ORDER — FLUPHENAZINE HCL 5 MG PO TABS: 5.0000 mg | ORAL_TABLET | ORAL | 0 refills | Status: AC

## 2016-07-07 MED ORDER — NICOTINE 14 MG/24HR TD PT24: 14.0000 mg | MEDICATED_PATCH | Freq: Every day | TRANSDERMAL | 0 refills | Status: AC

## 2016-07-07 MED ORDER — BENZTROPINE MESYLATE 0.5 MG PO TABS: 0.5000 mg | ORAL_TABLET | ORAL | 0 refills | Status: AC

## 2016-07-07 MED ORDER — SERTRALINE HCL 100 MG PO TABS: 100.0000 mg | ORAL_TABLET | Freq: Every day | ORAL | 0 refills | Status: AC

## 2016-07-07 MED ORDER — GABAPENTIN 100 MG PO CAPS: 100.0000 mg | ORAL_CAPSULE | Freq: Three times a day (TID) | ORAL | 0 refills | Status: AC

## 2016-07-07 MED ORDER — TRAZODONE HCL 50 MG PO TABS
50.0000 mg | ORAL_TABLET | Freq: Every evening | ORAL | 0 refills | Status: AC | PRN
Start: 2016-07-07 — End: ?

## 2016-07-07 MED ORDER — DOXYCYCLINE HYCLATE 100 MG PO TABS: 100.0000 mg | ORAL_TABLET | Freq: Two times a day (BID) | ORAL | 0 refills | Status: AC

## 2016-07-07 NOTE — Plan of Care (Signed)
Problem: Coping: Goal: Ability to cope will improve Outcome: Progressing Ability to cope will improve AEB reports if decrease anxiety "4" of 10, noting "getting back on medications" has been helpful.

## 2016-07-07 NOTE — Progress Notes (Signed)
Nursing Discharge Note 07/07/2016 0700-1305  Data Reports sleeping good without PRN sleep med.  Rates depression 3-4/10, hopelessness 0/10, and anxiety 5-6/10. Affect blunted but appropriate mood euthymic.  Denies HI, SI.  Reports auditory hallucinations have substantially approved and reports feeling ready to discharge today.  Received discharge orders.  Action Spoke with patient 1:1, nurse offered support to patient throughout shift.  Reviewed medications, discharge instructions, and follow up appointments with patient. Medication samples and scripts reviewed and given to patient.  Paperwork, AVS, SRA, and transition record handed to patient.   Escorted off of unit at . Belongings returned per belongings form.  Discharged to lobby- given 2 bus passes.    Response Verbalized understanding of discharge teaching. Agrees to contact someone or 911 with thoughts/intent to harm self or others.    To follow up per AVS.

## 2016-07-07 NOTE — Progress Notes (Signed)
Adult Psychoeducational Group Note  Date:  07/07/2016 Time:  3:52 AM  Group Topic/Focus:  Wrap-Up Group:   The focus of this group is to help patients review their daily goal of treatment and discuss progress on daily workbooks.  Participation Level:  Active  Participation Quality:  Appropriate, Attentive and Sharing  Affect:  Appropriate  Cognitive:  Appropriate  Insight: Appropriate and Good  Engagement in Group:  Developing/Improving  Modes of Intervention:  Discussion and Support  Additional Comments:  Pt attended group and stated she ready for discharge. Pt stated her discharge plans is to go [facility].   Karleen HampshireFox, Heber Hoog Brittini 07/07/2016, 3:52 AM

## 2016-07-07 NOTE — Plan of Care (Signed)
Problem: [facility] Participation in Recreation Therapeutic Interventions Goal: STG-Patient will identify at least five coping skills for ** STG: Coping Skills - Patient will be able to identify at least 5 coping skills for S/I by conclusion of recreation therapy tx Outcome: Completed/Met Date Met: 07/07/16 Pt identified coping skills at completion of coping skills recreation therapy session.  Victorino Sparrow, LRT/CTRS
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
## 2016-07-07 NOTE — Progress Notes (Signed)
Recreation Therapy Notes  Date: 07/07/16 Time: 1000 Location: 300 Hall Dayroom  Group Topic: Stress Management  Goal Area(s) Addresses:  Patient will verbalize importance of using healthy stress management.  Patient will identify positive emotions associated with healthy stress management.   Behavioral Response: Engaged  Intervention: Stress Management  Activity :  Peaceful Waves, Music.  LRT introduced the stress management techniques of guided imagery and music.  Patients were to listen and follow along as LRT read script to participate in guided imagery.  Patients also listened to music, socialized and moved along with the music.  Education:  Stress Management, Discharge Planning.   Education Outcome: Acknowledges edcuation/In group clarification offered/Needs additional education  Clinical Observations/Feedback: Pt expressed sarcastically "if you use stress management, you won't kill somebody".  Pt was appropriate and engaged during group.   Caroll RancherMarjette Sheppard Luckenbach, LRT/CTRS         Caroll RancherLindsay, Iain Sawchuk A 07/07/2016 11:27 AM

## 2016-07-07 NOTE — Progress Notes (Addendum)
  [facility] :  Will you be returning to the same living situation after discharge:  Yes,  home with SO At discharge, do you have transportation home?: Yes,  bus pass Do you have the ability to pay for your medications: Yes,  mental health  Release of information consent forms completed and in the chart;  Patient's signature needed at discharge.  Patient to Follow up at: Follow-up Information    [facility]. Go on 07/10/2016.   Specialty:  Behavioral Health Why:  FYIGeanie Berlin: Jenai you are hereby instructed & [facility] & have your blood drawn to check your Lithium level. Please use Open Access to request medications management and therapy if desired.  Hours are 8:30 - 3 Mon-Fr Contact information: [address] [place] [address] [phone]           Next level of care provider [facility] Link:no  Safety Planning and Suicide Prevention discussed: Yes,  yes  Have you used any form of tobacco in the last 30 days? (Cigarettes, Smokeless Tobacco, Cigars, and/or Pipes): Yes  Has patient been referred to the Quitline?: Patient refused referral  Patient has been referred for addiction treatment: Pt. refused referral  Sallee Langenne C Cree Napoli 07/07/2016, 4:50 PM

## 2016-07-07 NOTE — Discharge Summary (Signed)
Physician Discharge Summary Note  Patient:  [name] is an 36 y.o., female MRN:  [id number] DOB:  03/23/1980 Patient phone:  [phone] (home)  Patient address:   [address] [place] [address],  Total Time spent with patient: Greater than 30 minutes  Date of Admission:  07/03/2016 Date of Discharge: 07/07/2016  Reason for Admission:    Principal Problem: Schizoaffective disorder, bipolar type [facility])  Discharge Diagnoses: Patient Active Problem List   Diagnosis Date Noted  . Schizoaffective disorder, bipolar type (HCC) [F25.0] 05/31/2011    Priority: High  . PTSD (post-traumatic stress disorder) [F43.10] 05/30/2016  . Alcohol use disorder, moderate, dependence (HCC) [F10.20] 05/30/2016  . Tobacco use disorder [F17.200] 05/30/2016  . Positive blood cultures [R78.81] 05/27/2016  . Alcohol abuse [F10.10] 05/27/2016  . Cigarette smoker [F17.210] 05/27/2016  . Morbid obesity (HCC) [E66.01] 05/27/2016  . Anxiety [F41.9] 05/27/2016  . Substance use disorder [F19.90] 05/24/2016   Past Psychiatric History:  See H & P  Past Medical History:  Past Medical History:  Diagnosis Date  . Anemia   . Anxiety   . Asthma   . Depression   . Headache(784.0)   . Pneumonia   . Schizo affective schizophrenia (HCC)   . Substance use disorder 05/24/2016    Past Surgical History:  Procedure Laterality Date  . CESAREAN SECTION  2006  . THYROID SURGERY  2003   Family History:  Family History  Problem Relation Age of Onset  . Mental illness Mother   . Alcohol abuse Father   . Drug abuse Father    Family Psychiatric  History: See H&P  Social History:  History  Alcohol Use  . 10.2 oz/week  . 2 Glasses of wine, 12 Cans of beer, 3 Shots of liquor per week     History  Drug Use  . Types: Marijuana, Cocaine    Comment: THC used once last month    Social History   Social History  . Marital status: Single    Spouse name: N/A  . Number of children: N/A  . Years of  education: N/A   Social History Main Topics  . Smoking status: Current Every Day Smoker    Packs/day: 0.50  . Smokeless tobacco: Never Used  . Alcohol use 10.2 oz/week    2 Glasses of wine, 12 Cans of beer, 3 Shots of liquor per week  . Drug use: Yes    Types: Marijuana, Cocaine     Comment: THC used once last month  . Sexual activity: Yes    Birth control/ protection: None     Comment: Depo--thinks she due for her next shot this month/ LMP unable to recall   Other Topics Concern  . None   Social History Narrative  . None   Hospital Course:  36 yo Caucasian female, single. Lives with her fiancee. Background history of early life trauma, PTSD and schizoaffective disorder. Admitted on account of worsening depression. This was associated with auditory hallucinations and suicidal thoughts. Onset was after an emotionally charged argument with her mom.   After evaluation of her presenting symptoms, Antonetta was medicated & discharged on; Cogentin 0.5 mg for EPS, Prolixin 5 mg for mood control, Gabapentin 100 mg for agitation, Lithium carbonate 300 mg for mood stabilization, Nicotine patch 14 mg for smoking cessation, Sertraline 100 mg for depression & Trazodone 50 mg for insomnia.  Mariyana received other medication regimen for the other medical issues presented. She tolerated her treatment regimen without  any adverse effects or reactions reported. She was enrolled & participated in the group counseling sessions being offered and held on this unit. Monti learned coping skills that should help er after discharge to cope better & maintain mood stability.  Kamelia is seen today. Says she was off her medication at home and came in to get back on them. She feels better now she is back on her medications. Says she has realized that her mom is not the right person to discuss her childhood trauma with.  She would follow up with a therapist now. Says she feels good. She would be staying with her boyfriend at a  motel. Her mom has paid for a month stay. Patient says she is no longer having suicidal thoughts. She is no longer feeling frustrated. She is no longer in any form of distress. Says she plans to take it one moment at a time. No evidence of dissociation. No evidence of psychosis. No residual PTSD symptoms. No evidence of mania. No thoughts of violence. Says she is able to think clearly. No racing thoughts.   Nursing staff reports that patient has been appropriate on the unit. Patient has been interacting well with peers. No behavioral issues. Patient has not voiced any suicidal thoughts. Patient has not been observed to be internally stimulated. Patient has been adherent with treatment recommendations. Patient has been tolerating their medication well. Shermaine will continue mental health care on outpatient basis as noted below. She left [facility] with all personal belongings in no apparent distress. Transportation per city bus. [facility] assisted with bus pass. She was provided with a 7 days worth supply samples of her [facility] discharge medications by the [facility] pharmacy.  Physical Findings: AIMS: Facial and Oral Movements Muscles of Facial Expression: None, normal Lips and Perioral Area: None, normal Jaw: None, normal Tongue: None, normal,Extremity Movements Upper (arms, wrists, hands, fingers): None, normal Lower (legs, knees, ankles, toes): None, normal, Trunk Movements Neck, shoulders, hips: None, normal, Overall Severity Severity of abnormal movements (highest score from questions above): None, normal Incapacitation due to abnormal movements: None, normal Patient's awareness of abnormal movements (rate only patient's report): No Awareness, Dental Status Current problems with teeth and/or dentures?: No Does patient usually wear dentures?: No  CIWA:  CIWA-Ar Total: 0 COWS:     Musculoskeletal: Strength & Muscle Tone: within normal limits Gait & Station: normal Patient leans: N/A  Psychiatric Specialty Exam:   See MD SRA Physical Exam  Nursing note and vitals reviewed. Constitutional: She appears well-developed and well-nourished.  HENT:  Head: Normocephalic.  Eyes: Pupils are equal, round, and reactive to light.  Neck: Normal range of motion.  Cardiovascular: Normal rate.   Respiratory: Effort normal.  GI: Soft.  Genitourinary:  Genitourinary Comments: Deferred  Musculoskeletal: Normal range of motion.  Neurological: She is alert.  Skin: Skin is warm.    Review of Systems  Constitutional: Negative.   HENT: Negative.   Eyes: Negative.   Respiratory: Negative.   Cardiovascular: Negative.   Gastrointestinal: Negative.   Genitourinary: Negative.   Musculoskeletal: Negative.   Skin: Negative.   Neurological: Negative.   Endo/Heme/Allergies: Negative.   Psychiatric/Behavioral: Positive for depression (Stable).    Blood pressure 108/83, pulse 99, temperature 97.8 F (36.6 C), temperature source Oral, resp. rate 18, height 5' 4.5" (1.638 m), weight 107.5 kg (237 lb), last menstrual period 06/27/2016, SpO2 100 %.Body mass index is 40.05 kg/m.   Have you used any form of tobacco in the last 30 days? (Cigarettes,  Smokeless Tobacco, Cigars, and/or Pipes): Yes  Has this patient used any form of tobacco in the last 30 days? (Cigarettes, Smokeless Tobacco, Cigars, and/or Pipes)"Yes, she was provided with a nicotine patch prescription upon discharge.  Blood Alcohol level:  Lab Results  Component Value Date   ETH 178 (H) 07/02/2016   ETH <5 05/22/2016   Metabolic Disorder Labs:  Lab Results  Component Value Date   HGBA1C 5.4 07/05/2016   MPG 108 07/05/2016   Lab Results  Component Value Date   PROLACTIN 45.7 (H) 07/05/2016   Lab Results  Component Value Date   CHOL 182 07/05/2016   TRIG 172 (H) 07/05/2016   HDL 51 07/05/2016   CHOLHDL 3.6 07/05/2016   VLDL 34 07/05/2016   LDLCALC 97 07/05/2016   See Psychiatric Specialty Exam and Suicide Risk Assessment completed by Attending  Physician prior to discharge.  Discharge destination:  Home  Is patient on multiple antipsychotic therapies at discharge:  No   Has Patient had three or more failed trials of antipsychotic monotherapy by history:  No  Recommended Plan for Multiple Antipsychotic Therapies: NA  Allergies as of 07/07/2016   No Known Allergies     Medication List    STOP taking these medications   busPIRone 5 MG tablet Commonly known as:  BUSPAR   chlorproMAZINE 25 MG tablet Commonly known as:  THORAZINE   chlorproMAZINE 50 MG tablet Commonly known as:  THORAZINE   hydrOXYzine 25 MG tablet Commonly known as:  ATARAX/VISTARIL   nicotine 21 mg/24hr patch Commonly known as:  NICODERM CQ - dosed in mg/24 hours Replaced by:  nicotine 14 mg/24hr patch     TAKE these medications     Indication  benztropine 0.5 MG tablet Commonly known as:  COGENTIN Take 1 tablet (0.5 mg total) by mouth 2 (two) times daily in the am and at bedtime.. For prevention of drug induced tremors  Indication:  Extrapyramidal Reaction caused by Medications   doxycycline 100 MG tablet Commonly known as:  VIBRA-TABS Take 1 tablet (100 mg total) by mouth every 12 (twelve) hours. For skin infection  Indication:  Infected cyst   fluPHENAZine 5 MG tablet Commonly known as:  PROLIXIN Take 1 tablet (5 mg total) by mouth 2 (two) times daily in the am and at bedtime.. For mood control  Indication:  Mood control   gabapentin 100 MG capsule Commonly known as:  NEURONTIN Take 1 capsule (100 mg total) by mouth 3 (three) times daily. For agitation  Indication:  Agitation   lithium carbonate 300 MG CR tablet Commonly known as:  LITHOBID Take 1 tablet (300 mg total) by mouth 2 (two) times daily with a meal. For mood stabilization  Indication:  Mood stabilization   nicotine 14 mg/24hr patch Commonly known as:  NICODERM CQ - dosed in mg/24 hours Place 1 patch (14 mg total) onto the skin daily. For smoking cessation Start taking  on:  07/08/2016 Replaces:  nicotine 21 mg/24hr patch  Indication:  Nicotine Addiction   sertraline 100 MG tablet Commonly known as:  ZOLOFT Take 1 tablet (100 mg total) by mouth daily. For depression Start taking on:  07/08/2016 What changed:  additional instructions  Indication:  Major Depressive Disorder   traZODone 50 MG tablet Commonly known as:  DESYREL Take 1 tablet (50 mg total) by mouth at bedtime as needed for sleep. What changed:  medication strength  how much to take  Indication:  Trouble Sleeping  Follow-up Information    [facility] Follow up on 07/10/2016.   Specialty:  Behavioral Health Why:  FYIGeanie Berlin you are hereby instructed & referred to go to the [facility] on Monday 07-10-16 & have your blood drawn to check your Lithium level.  [address] [place] [address] [phone]          Follow-up recommendations: Activity:  As tolerated Diet: As recommended by your primary care doctor. Keep all scheduled follow-up appointments as recommended.   Comments: Patient is instructed prior to discharge to: Take all medications as prescribed by his/her mental healthcare provider. Report any adverse effects and or reactions from the medicines to his/her outpatient provider promptly. Patient has been instructed & cautioned: To not engage in alcohol and or illegal drug use while on prescription medicines. In the event of worsening symptoms, patient is instructed to call the crisis hotline, 911 and or go to the nearest ED for appropriate evaluation and treatment of symptoms. To follow-up with his/her primary care provider for your other medical issues, concerns and or health care needs.    Signed: Sanjuana Kava, NP PMHNP, FNP-BC 07/07/2016, 10:48 AM

## 2016-07-20 ENCOUNTER — Encounter (HOSPITAL_COMMUNITY): Payer: Self-pay | Admitting: *Deleted

## 2016-07-20 ENCOUNTER — Emergency Department (HOSPITAL_COMMUNITY)
Admission: EM | Admit: 2016-07-20 | Discharge: 2016-07-20 | Disposition: A | Discharge status: Home / Self Care | Payer: Self-pay | Attending: Emergency Medicine | Admitting: Emergency Medicine

## 2016-07-20 DIAGNOSIS — R112 Nausea with vomiting, unspecified: Secondary | ICD-10-CM | POA: Insufficient documentation

## 2016-07-20 DIAGNOSIS — J45909 Unspecified asthma, uncomplicated: Secondary | ICD-10-CM | POA: Insufficient documentation

## 2016-07-20 DIAGNOSIS — Z79899 Other long term (current) drug therapy: Secondary | ICD-10-CM | POA: Insufficient documentation

## 2016-07-20 DIAGNOSIS — F17200 Nicotine dependence, unspecified, uncomplicated: Secondary | ICD-10-CM | POA: Insufficient documentation

## 2016-07-20 DIAGNOSIS — R1013 Epigastric pain: Secondary | ICD-10-CM | POA: Insufficient documentation

## 2016-07-20 LAB — COMPREHENSIVE METABOLIC PANEL
ALBUMIN: 4.6 g/dL (ref 3.5–5.0)
ALK PHOS: 71 U/L (ref 38–126)
ALT: 16 U/L (ref 14–54)
ANION GAP: 8 (ref 5–15)
AST: 23 U/L (ref 15–41)
BUN: 9 mg/dL (ref 6–20)
CALCIUM: 9.2 mg/dL (ref 8.9–10.3)
CO2: 27 mmol/L (ref 22–32)
Chloride: 104 mmol/L (ref 101–111)
Creatinine, Ser: 0.73 mg/dL (ref 0.44–1.00)
GFR calc Af Amer: >60 mL/min (ref >60)
GFR calc non Af Amer: >60 mL/min (ref >60)
GLUCOSE: 103 mg/dL — AB (ref 65–99)
Potassium: 3.5 mmol/L (ref 3.5–5.1)
SODIUM: 139 mmol/L (ref 135–145)
Total Bilirubin: 0.7 mg/dL (ref 0.3–1.2)
Total Protein: 8 g/dL (ref 6.5–8.1)

## 2016-07-20 LAB — CBC
HEMATOCRIT: 42.1 % (ref 36.0–46.0)
HEMOGLOBIN: 14.3 g/dL (ref 12.0–15.0)
MCH: 32.1 pg (ref 26.0–34.0)
MCHC: 34 g/dL (ref 30.0–36.0)
MCV: 94.6 fL (ref 78.0–100.0)
Platelets: 347 10*3/uL (ref 150–400)
RBC: 4.45 MIL/uL (ref 3.87–5.11)
RDW: 13.8 % (ref 11.5–15.5)
WBC: 13.8 10*3/uL — ABNORMAL HIGH (ref 4.0–10.5)

## 2016-07-20 LAB — URINALYSIS, ROUTINE W REFLEX MICROSCOPIC
Bilirubin Urine: NEGATIVE
GLUCOSE, UA: NEGATIVE mg/dL
Hgb urine dipstick: NEGATIVE
KETONES UR: NEGATIVE mg/dL
Nitrite: NEGATIVE
PH: 5 (ref 5.0–8.0)
Protein, ur: NEGATIVE mg/dL
Specific Gravity, Urine: 1.02 (ref 1.005–1.030)

## 2016-07-20 LAB — POC URINE PREG, ED: Preg Test, Ur: NEGATIVE

## 2016-07-20 LAB — LIPASE, BLOOD: Lipase: 20 U/L (ref 11–51)

## 2016-07-20 MED ORDER — ONDANSETRON HCL 4 MG/2ML IJ SOLN
4.0000 mg | Freq: Once | INTRAMUSCULAR | Status: AC
  Administered 2016-07-20: 4 mg via INTRAVENOUS
  Filled 2016-07-20: qty 2

## 2016-07-20 MED ORDER — MORPHINE SULFATE (PF) 2 MG/ML IV SOLN
4.0000 mg | Freq: Once | INTRAVENOUS | Status: AC
  Administered 2016-07-20: 4 mg via INTRAVENOUS
  Filled 2016-07-20: qty 2

## 2016-07-20 MED ORDER — GI COCKTAIL ~~LOC~~
30.0000 mL | Freq: Once | ORAL | Status: AC
  Administered 2016-07-20: 30 mL via ORAL
  Filled 2016-07-20: qty 30

## 2016-07-20 MED ORDER — GI COCKTAIL ~~LOC~~: 30.0000 mL | Freq: Once | ORAL | Status: DC

## 2016-07-20 MED ORDER — SODIUM CHLORIDE 0.9 % IV BOLUS (SEPSIS)
1000.0000 mL | Freq: Once | INTRAVENOUS | Status: AC
  Administered 2016-07-20: 1000 mL via INTRAVENOUS

## 2016-07-20 MED ORDER — FAMOTIDINE 40 MG PO TABS: 40.0000 mg | ORAL_TABLET | Freq: Every day | ORAL | 0 refills | Status: AC

## 2016-07-20 NOTE — ED Notes (Signed)
Bed: [id number] Expected date:  Expected time:  Means of arrival:  Comments: EMS 30s, abd pain

## 2016-07-20 NOTE — ED Provider Notes (Signed)
[facility]-EMERGENCY DEPT Provider Note   CSN: [id number] Arrival date & time: 07/20/16  1809     History   Chief Complaint Chief Complaint  Patient presents with  . Abdominal Pain    HPI [name] is a 36 y.o. female who presents with abdominal pain. PMH significant for schizoaffective d/o, substance abuse d/o. She reports acute onset of nausea and vomiting this morning. Afterwards she developed epigastric and left upper quadrant pain. It is constant and moderate in nature. She has not tried any medicines at home. It feels similar to when she's had gastritis in the past. She denies any alcohol use in the past week. No fever, chest pain, shortness of breath, diarrhea, constipation, dysuria, vaginal bleeding or discharge. Her last menstrual period was at the beginning of the month and was lighter than usual.  HPI  Past Medical History:  Diagnosis Date  . Anemia   . Anxiety   . Asthma   . Depression   . Headache(784.0)   . Pneumonia   . Schizo affective schizophrenia (HCC)   . Substance use disorder 05/24/2016    Patient Active Problem List   Diagnosis Date Noted  . PTSD (post-traumatic stress disorder) 05/30/2016  . Alcohol use disorder, moderate, dependence (HCC) 05/30/2016  . Tobacco use disorder 05/30/2016  . Positive blood cultures 05/27/2016  . Morbid obesity (HCC) 05/27/2016  . Anxiety 05/27/2016  . Substance use disorder 05/24/2016  . Schizoaffective disorder, bipolar type (HCC) 05/31/2011    Past Surgical History:  Procedure Laterality Date  . CESAREAN SECTION  2006  . THYROID SURGERY  2003    OB History    No data available       Home Medications    Prior to Admission medications   Medication Sig Start Date End Date Taking? Authorizing Provider  benztropine (COGENTIN) 0.5 MG tablet Take 1 tablet (0.5 mg total) by mouth 2 (two) times daily in the am and at bedtime.. For prevention of drug induced tremors 07/07/16   Armandina Stammer I, NP  doxycycline  (VIBRA-TABS) 100 MG tablet Take 1 tablet (100 mg total) by mouth every 12 (twelve) hours. For skin infection 07/07/16   Armandina Stammer I, NP  fluPHENAZine (PROLIXIN) 5 MG tablet Take 1 tablet (5 mg total) by mouth 2 (two) times daily in the am and at bedtime.. For mood control 07/07/16   Armandina Stammer I, NP  gabapentin (NEURONTIN) 100 MG capsule Take 1 capsule (100 mg total) by mouth 3 (three) times daily. For agitation 07/07/16   Armandina Stammer I, NP  lithium carbonate (LITHOBID) 300 MG CR tablet Take 1 tablet (300 mg total) by mouth 2 (two) times daily with a meal. For mood stabilization 07/07/16   Nwoko, Nicole Kindred I, NP  nicotine (NICODERM CQ - DOSED IN MG/24 HOURS) 14 mg/24hr patch Place 1 patch (14 mg total) onto the skin daily. For smoking cessation 07/08/16   Armandina Stammer I, NP  sertraline (ZOLOFT) 100 MG tablet Take 1 tablet (100 mg total) by mouth daily. For depression 07/08/16   Armandina Stammer I, NP  traZODone (DESYREL) 50 MG tablet Take 1 tablet (50 mg total) by mouth at bedtime as needed for sleep. 07/07/16   Sanjuana Kava, NP    Family History Family History  Problem Relation Age of Onset  . Mental illness Mother   . Alcohol abuse Father   . Drug abuse Father     Social History Social History  Substance Use Topics  . Smoking status:  Current Every Day Smoker    Packs/day: 0.50  . Smokeless tobacco: Never Used  . Alcohol use 10.2 oz/week    2 Glasses of wine, 12 Cans of beer, 3 Shots of liquor per week     Allergies   Patient has no known allergies.   Review of Systems Review of Systems  Constitutional: Negative for chills and fever.  Respiratory: Negative for shortness of breath.   Cardiovascular: Negative for chest pain.  Gastrointestinal: Positive for abdominal pain, nausea and vomiting. Negative for constipation and diarrhea.  Genitourinary: Negative for dysuria, vaginal bleeding and vaginal discharge.  All other systems reviewed and are negative.    Physical Exam Updated  Vital Signs BP (!) 132/92 (BP Location: Right Arm)   Pulse 85   Temp 98.7 F (37.1 C) (Oral)   Resp 18   LMP 06/27/2016   SpO2 97%   Physical Exam  Constitutional: She is oriented to person, place, and time. She appears well-developed and well-nourished. No distress.  HENT:  Head: Normocephalic and atraumatic.  Eyes: Conjunctivae are normal. Pupils are equal, round, and reactive to light. Right eye exhibits no discharge. Left eye exhibits no discharge. No scleral icterus.  Neck: Normal range of motion.  Cardiovascular: Normal rate and regular rhythm.  Exam reveals no gallop and no friction rub.   No murmur heard. Pulmonary/Chest: Effort normal and breath sounds normal. No respiratory distress. She has no wheezes. She has no rales. She exhibits no tenderness.  Abdominal: Soft. Bowel sounds are normal. She exhibits no distension and no mass. There is tenderness (Epigastric and left upper quadrant tenderness). There is no rebound and no guarding. No hernia.  Neurological: She is alert and oriented to person, place, and time.  Skin: Skin is warm and dry.  Psychiatric: She has a normal mood and affect. Her behavior is normal.  Nursing note and vitals reviewed.    ED Treatments / Results  Labs (all labs ordered are listed, but only abnormal results are displayed) Labs Reviewed  COMPREHENSIVE METABOLIC PANEL - Abnormal; Notable for the following:       Result Value   Glucose, Bld 103 (*)    All other components within normal limits  CBC - Abnormal; Notable for the following:    WBC 13.8 (*)    All other components within normal limits  URINALYSIS, ROUTINE W REFLEX MICROSCOPIC - Abnormal; Notable for the following:    APPearance HAZY (*)    Leukocytes, UA SMALL (*)    Bacteria, UA RARE (*)    Squamous Epithelial / LPF 6-30 (*)    All other components within normal limits  LIPASE, BLOOD  POC URINE PREG, ED    EKG  EKG Interpretation None       Radiology No results  found.  Procedures Procedures (including critical care time)  Medications Ordered in ED Medications  sodium chloride 0.9 % bolus 1,000 mL (0 mLs Intravenous Stopped 07/20/16 2103)  morphine 2 MG/ML injection 4 mg (4 mg Intravenous Given 07/20/16 1910)  ondansetron (ZOFRAN) injection 4 mg (4 mg Intravenous Given 07/20/16 1910)  gi cocktail (Maalox,Lidocaine,Donnatal) (30 mLs Oral Given 07/20/16 2025)     Initial Impression / Assessment and Plan / ED Course  I have reviewed the triage vital signs and the nursing notes.  Pertinent labs & imaging results that were available during my care of the patient were reviewed by me and considered in my medical decision making (see chart for details).  36 year old  female with epigastric pain, N/V consistent with gastritis. Vitals are normal. She is minimally tender. CBC remarkable for leukocytosis of 13.8. CMP and lipase are unremarkable. UA clean. Preg test is negative. Will give Zofran, morphine, fluids and recheck.  Pt reports feeling better and wants to eat. Will try GI cocktail and PO challenge.  Pt reports feeling significantly better after GI cocktail. She tolerated PO challenge. Will d/c with return precautions.  Final Clinical Impressions(s) / ED Diagnoses   Final diagnoses:  Epigastric pain    New Prescriptions New Prescriptions   No medications on file     Beryle QuantGekas, Anapaula Severt Marie, PA-C 07/22/16 1019    Loren RacerYelverton, David, MD 07/31/16 2352

## 2016-07-20 NOTE — ED Triage Notes (Signed)
Per EMS, pt complains of left sided abdominal pain. Pt states she vomited 3-4 times today, last vomited 1 hour ago. Pt states she would like a pregnancy test.  BP 128/82 HR 80 CBG 148

## 2016-07-20 NOTE — ED Notes (Signed)
RN at bedside with ultrasound IV.  Will place IV and collect labs.

## 2016-07-20 NOTE — Discharge Instructions (Signed)
Take pepcid for the next week Avoid NSAIDs (Ibuprofen, Naproxen), alcohol, or spicy foods Follow up with your family doctor Return for worsening symptoms

## 2016-08-13 ENCOUNTER — Emergency Department (HOSPITAL_COMMUNITY): Payer: No Typology Code available for payment source

## 2016-08-13 ENCOUNTER — Emergency Department (HOSPITAL_COMMUNITY)
Admission: EM | Admit: 2016-08-13 | Discharge: 2016-08-13 | Disposition: A | Discharge status: Home / Self Care | Payer: No Typology Code available for payment source | Attending: Physician Assistant | Admitting: Physician Assistant

## 2016-08-13 ENCOUNTER — Encounter (HOSPITAL_COMMUNITY): Payer: Self-pay | Admitting: Emergency Medicine

## 2016-08-13 DIAGNOSIS — S0990XA Unspecified injury of head, initial encounter: Secondary | ICD-10-CM | POA: Diagnosis present

## 2016-08-13 DIAGNOSIS — Y939 Activity, unspecified: Secondary | ICD-10-CM | POA: Diagnosis not present

## 2016-08-13 DIAGNOSIS — J449 Chronic obstructive pulmonary disease, unspecified: Secondary | ICD-10-CM | POA: Diagnosis not present

## 2016-08-13 DIAGNOSIS — Y92410 Unspecified street and highway as the place of occurrence of the external cause: Secondary | ICD-10-CM | POA: Diagnosis not present

## 2016-08-13 DIAGNOSIS — F17200 Nicotine dependence, unspecified, uncomplicated: Secondary | ICD-10-CM | POA: Insufficient documentation

## 2016-08-13 DIAGNOSIS — M7918 Myalgia, other site: Secondary | ICD-10-CM

## 2016-08-13 DIAGNOSIS — S0101XA Laceration without foreign body of scalp, initial encounter: Secondary | ICD-10-CM | POA: Insufficient documentation

## 2016-08-13 DIAGNOSIS — R519 Headache, unspecified: Secondary | ICD-10-CM

## 2016-08-13 DIAGNOSIS — R51 Headache: Secondary | ICD-10-CM

## 2016-08-13 DIAGNOSIS — Z79899 Other long term (current) drug therapy: Secondary | ICD-10-CM | POA: Diagnosis not present

## 2016-08-13 DIAGNOSIS — Y999 Unspecified external cause status: Secondary | ICD-10-CM | POA: Diagnosis not present

## 2016-08-13 HISTORY — DX: Chronic obstructive pulmonary disease, unspecified: J44.9

## 2016-08-13 LAB — I-STAT BETA HCG BLOOD, ED (MC, WL, AP ONLY)

## 2016-08-13 MED ORDER — SODIUM CHLORIDE 0.9 % IV BOLUS (SEPSIS)
500.0000 mL | Freq: Once | INTRAVENOUS | Status: AC
  Administered 2016-08-13: 500 mL via INTRAVENOUS

## 2016-08-13 MED ORDER — TETANUS-DIPHTH-ACELL PERTUSSIS 5-2.5-18.5 LF-MCG/0.5 IM SUSP
0.5000 mL | Freq: Once | INTRAMUSCULAR | Status: AC
  Administered 2016-08-13: 0.5 mL via INTRAMUSCULAR
  Filled 2016-08-13: qty 0.5

## 2016-08-13 MED ORDER — KETOROLAC TROMETHAMINE 30 MG/ML IJ SOLN
30.0000 mg | Freq: Once | INTRAMUSCULAR | Status: AC
  Administered 2016-08-13: 30 mg via INTRAVENOUS
  Filled 2016-08-13: qty 1

## 2016-08-13 MED ORDER — METHOCARBAMOL 500 MG PO TABS
500.0000 mg | ORAL_TABLET | Freq: Once | ORAL | Status: AC
  Administered 2016-08-13: 500 mg via ORAL
  Filled 2016-08-13: qty 1

## 2016-08-13 MED ORDER — BACITRACIN ZINC 500 UNIT/GM EX OINT
TOPICAL_OINTMENT | Freq: Two times a day (BID) | CUTANEOUS | Status: DC
  Administered 2016-08-13: 1 via TOPICAL
  Filled 2016-08-13: qty 0.9

## 2016-08-13 MED ORDER — CYCLOBENZAPRINE HCL 10 MG PO TABS: 10.0000 mg | ORAL_TABLET | Freq: Two times a day (BID) | ORAL | 0 refills | Status: AC | PRN

## 2016-08-13 NOTE — Progress Notes (Signed)
Orthopedic Tech Progress Note Patient Details:  [name] 12/24/1980 [id number]  Ortho Devices Type of Ortho Device: Velcro wrist splint Ortho Device/Splint Interventions: Application   Saul FordyceJennifer C Rosea Dory 08/13/2016, 1:33 PM

## 2016-08-13 NOTE — ED Notes (Addendum)
PT in radiology at this time.  
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
## 2016-08-13 NOTE — ED Notes (Signed)
Returns from XR

## 2016-08-13 NOTE — Discharge Instructions (Signed)
As we discussed your one to be very sore for the next few days.  You can take Tylenol or Ibuprofen as directed for pain.  Follow-up with one of the clinics provided below for further evaluation.  Take Flexeril as prescribed. This medication will make you drowsy so do not drive or drink alcohol when taking it.  Apply over-the-counter bacitracin or Neosporin to the sore on her head. Make sure you continue to keep it clean and dry.  Return to the emergency department for any difficulty walking, dizziness, numbness of her arms or legs, redness or swelling around the wound on her head, drainage from the wound, fevers or any other worsening or concerning symptoms.  If you do not have a primary care doctor you see regularly, please you the list below. Please call them to arrange for follow-up.    No Primary Care Doctor Call [facility]  [phone] Other agencies that provide inexpensive medical care    [facility]  [phone]    [facility]  [phone]    [facility]  [phone]    [facility]  [phone]    [facility]  [phone]    [facility]  [phone]

## 2016-08-13 NOTE — ED Notes (Signed)
Patient transported to CT 
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
